# Patient Record
Sex: Female | Born: 2000 | Hispanic: No | Marital: Single | Smoking: Never smoker
Health system: Southern US, Community
[De-identification: ages and names within clinical notes are randomized; demographics above are authoritative.]

## PROBLEM LIST (undated history)

## (undated) DIAGNOSIS — F32A Depression, unspecified: Secondary | ICD-10-CM

## (undated) DIAGNOSIS — F329 Major depressive disorder, single episode, unspecified: Secondary | ICD-10-CM

## (undated) DIAGNOSIS — F419 Anxiety disorder, unspecified: Secondary | ICD-10-CM

---
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
## 2019-04-14 ENCOUNTER — Ambulatory Visit (INDEPENDENT_AMBULATORY_CARE_PROVIDER_SITE_OTHER): Payer: BLUE CROSS/BLUE SHIELD | Admitting: Physician Assistant

## 2019-04-14 ENCOUNTER — Encounter: Payer: Self-pay | Admitting: Physician Assistant

## 2019-04-14 VITALS — BP 139/94 | HR 103 | Ht 63.0 in | Wt 195.0 lb

## 2019-04-14 DIAGNOSIS — F410 Panic disorder [episodic paroxysmal anxiety] without agoraphobia: Secondary | ICD-10-CM

## 2019-04-14 DIAGNOSIS — F419 Anxiety disorder, unspecified: Secondary | ICD-10-CM

## 2019-04-14 MED ORDER — CLONAZEPAM 0.5 MG PO TABS: 0.5000 mg | ORAL_TABLET | Freq: Two times a day (BID) | ORAL | 1 refills | Status: DC | PRN

## 2019-04-14 NOTE — Progress Notes (Signed)
Patient having increased issues with anxiety. PHQ9-GAD7 completed.  Parents have recently separated and she thinks this is contributing.
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
## 2019-04-17 DIAGNOSIS — F419 Anxiety disorder, unspecified: Secondary | ICD-10-CM | POA: Insufficient documentation

## 2019-04-17 DIAGNOSIS — F410 Panic disorder [episodic paroxysmal anxiety] without agoraphobia: Secondary | ICD-10-CM | POA: Insufficient documentation

## 2019-04-17 DIAGNOSIS — F329 Major depressive disorder, single episode, unspecified: Secondary | ICD-10-CM | POA: Insufficient documentation

## 2019-05-12 ENCOUNTER — Encounter: Payer: Self-pay | Admitting: Sports Medicine

## 2019-05-12 ENCOUNTER — Ambulatory Visit (INDEPENDENT_AMBULATORY_CARE_PROVIDER_SITE_OTHER): Payer: BC Managed Care – PPO | Admitting: Sports Medicine

## 2019-05-12 DIAGNOSIS — F329 Major depressive disorder, single episode, unspecified: Secondary | ICD-10-CM | POA: Diagnosis not present

## 2019-05-12 DIAGNOSIS — F419 Anxiety disorder, unspecified: Secondary | ICD-10-CM

## 2019-05-12 DIAGNOSIS — F32A Depression, unspecified: Secondary | ICD-10-CM

## 2019-05-12 MED ORDER — VORTIOXETINE HBR 5 MG PO TABS: 5.0000 mg | ORAL_TABLET | Freq: Every day | ORAL | 3 refills | Status: DC

## 2019-05-12 NOTE — Assessment & Plan Note (Signed)
Partial improvement with Klonopin but anxiety is becoming severe to the point where she is needing it daily, for this reason we are going to add a controller agent. Has not done well with Lexapro in the past, weight gain with other SSRIs. We are going to try Trintellix, starting at 5 mg, they will download a discount coupon. We can do another virtual visit in 1 month either myself or Iran Planas, PA-C to evaluate efficacy and increase dose if needed.

## 2019-05-12 NOTE — Progress Notes (Signed)
Virtual Visit via WebEx/MyChart   I connected with  Rachel Padilla  on 05/12/19 via WebEx/MyChart/Doximity Video and verified that I am speaking with the correct person using two identifiers.   I discussed the limitations, risks, security and privacy concerns of performing an evaluation and management service by WebEx/MyChart/Doximity Video, including the higher likelihood of inaccurate diagnosis and treatment, and the availability of in person appointments.  We also discussed the likely need of an additional face to face encounter for complete and high quality delivery of care.  I also discussed with the patient that there may be a patient responsible charge related to this service. The patient expressed understanding and wishes to proceed.  Provider location is either at home or medical facility. Patient location is at their home, different from provider location. People involved in care of the patient during this telehealth encounter were myself, my nurse/medical assistant, and my front office/scheduling team member.  Subjective:    CC: Anxiety  HPI: This is a very pleasant 18 year old female, she has no generalized anxiety disorder, unfortunately she has had to use more, and more of her benzodiazepine.  She is interested in something to control her anxiety and to decrease her use of her Klonopin.  No suicidal or homicidal ideation.  She has failed Lexapro in the past, as well as had bad responses to Zoloft.  I reviewed the past medical history, family history, social history, surgical history, and allergies today and no changes were needed.  Please see the problem list section below in epic for further details.  Past Medical History: No past medical history on file. Past Surgical History: No past surgical history on file. Social History: Social History   Socioeconomic History  . Marital status: Single    Spouse name: Not on file  . Number of children: Not on file  . Years of  education: Not on file  . Highest education level: Not on file  Occupational History  . Not on file  Social Needs  . Financial resource strain: Not on file  . Food insecurity    Worry: Not on file    Inability: Not on file  . Transportation needs    Medical: Not on file    Non-medical: Not on file  Tobacco Use  . Smoking status: Never Smoker  . Smokeless tobacco: Never Used  Substance and Sexual Activity  . Alcohol use: Not on file  . Drug use: Not on file  . Sexual activity: Never  Lifestyle  . Physical activity    Days per week: Not on file    Minutes per session: Not on file  . Stress: Not on file  Relationships  . Social Musicianconnections    Talks on phone: Not on file    Gets together: Not on file    Attends religious service: Not on file    Active member of club or organization: Not on file    Attends meetings of clubs or organizations: Not on file    Relationship status: Not on file  Other Topics Concern  . Not on file  Social History Narrative  . Not on file   Family History: Family History  Problem Relation Age of Onset  . Hypertension Father    Allergies: No Known Allergies Medications: See med rec.  Review of Systems: No fevers, chills, night sweats, weight loss, chest pain, or shortness of breath.   Objective:    General: Speaking full sentences, no audible heavy breathing.  Sounds alert  and appropriately interactive.  Appears well.  Face symmetric.  Extraocular movements intact.  Pupils equal and round.  No nasal flaring or accessory muscle use visualized.  No other physical exam performed due to the non-physical nature of this visit.  Impression and Recommendations:    Anxiety and depression Partial improvement with Klonopin but anxiety is becoming severe to the point where she is needing it daily, for this reason we are going to add a controller agent. Has not done well with Lexapro in the past, weight gain with other SSRIs. We are going to try  Trintellix, starting at 5 mg, they will download a discount coupon. We can do another virtual visit in 1 month either myself or Iran Planas, PA-C to evaluate efficacy and increase dose if needed.  I discussed the above assessment and treatment plan with the patient. The patient was provided an opportunity to ask questions and all were answered. The patient agreed with the plan and demonstrated an understanding of the instructions.   The patient was advised to call back or seek an in-person evaluation if the symptoms worsen or if the condition fails to improve as anticipated.   I provided 25 minutes of non-face-to-face time during this encounter, 15 minutes of additional time was needed to gather information, review chart, records, communicate/coordinate with staff remotely, troubleshooting the multiple errors that we get every time when trying to do video calls through the electronic medical record, WebEx, and Doximity, restart the encounter multiple times due to instability of the software, as well as complete documentation.   ___________________________________________ Gwen Her. Dianah Field, M.D., ABFM., CAQSM. Primary Care and Sports Medicine [facility]  Adjunct Professor of [employer]

## 2019-05-23 ENCOUNTER — Telehealth: Payer: Self-pay | Admitting: *Deleted

## 2019-05-23 NOTE — Telephone Encounter (Signed)
I've received a vm from the pt and one from her counselor.  Counselor wants your cooperation in changing Rachel Padilla's Trintellix to Prozac.  She wants to start her at 10mg  for 10 days then increase to 20mg .  Pt was just started on Trintellix on June 12th. Please advise on what you would like the pt to do.

## 2019-05-23 NOTE — Telephone Encounter (Signed)
It has not been long enough for it to work, Prozac takes even longer to work.  We need to give it a solid month before calling it a failure.  The reason we picked Trintellix was that it was more weight neutral than the older SSRIs such as Prozac.  I am happy to discuss this with them in a virtual visit.

## 2019-05-24 MED ORDER — FLUOXETINE HCL 20 MG PO TABS: ORAL_TABLET | ORAL | 3 refills | Status: DC

## 2019-05-24 NOTE — Telephone Encounter (Signed)
Switching to Prozac, return in 1 month to reevaluate.

## 2019-05-24 NOTE — Telephone Encounter (Signed)
Pt called again. She is not interested in a virtual visit because she does not want to continue taking the Trintellix she wants to take Prozac. Prozac was recommended by her therapist and she trusts her. When she takes the Trintellix, it makes her tired during the day and she does not want to do anything , she cannot sleep at night and on Sunday she breakdown and had a crying spell.

## 2019-05-24 NOTE — Telephone Encounter (Signed)
Spoke with patient, she will talk with her mother, and discuss if she needs to do do a virtual visit.

## 2019-05-25 NOTE — Telephone Encounter (Signed)
Called and advised pt of medication change. She states she will call back later to schedule her 1 month follow up

## 2019-06-19 ENCOUNTER — Encounter: Payer: Self-pay | Admitting: Sports Medicine

## 2019-06-19 ENCOUNTER — Other Ambulatory Visit: Payer: Self-pay

## 2019-06-19 ENCOUNTER — Ambulatory Visit: Payer: BC Managed Care – PPO | Admitting: Sports Medicine

## 2019-06-19 DIAGNOSIS — F329 Major depressive disorder, single episode, unspecified: Secondary | ICD-10-CM

## 2019-06-19 DIAGNOSIS — F419 Anxiety disorder, unspecified: Secondary | ICD-10-CM | POA: Diagnosis not present

## 2019-06-19 DIAGNOSIS — W57XXXA Bitten or stung by nonvenomous insect and other nonvenomous arthropods, initial encounter: Secondary | ICD-10-CM | POA: Diagnosis not present

## 2019-06-19 DIAGNOSIS — F410 Panic disorder [episodic paroxysmal anxiety] without agoraphobia: Secondary | ICD-10-CM

## 2019-06-19 DIAGNOSIS — F32A Depression, unspecified: Secondary | ICD-10-CM

## 2019-06-19 MED ORDER — CLONAZEPAM 0.5 MG PO TABS: 0.5000 mg | ORAL_TABLET | Freq: Two times a day (BID) | ORAL | 2 refills | Status: DC | PRN

## 2019-06-19 MED ORDER — DOXYCYCLINE HYCLATE 100 MG PO TABS: 100.0000 mg | ORAL_TABLET | Freq: Two times a day (BID) | ORAL | 0 refills | Status: AC

## 2019-06-19 MED ORDER — PREDNISONE 50 MG PO TABS: ORAL_TABLET | ORAL | 0 refills | Status: DC

## 2019-06-19 NOTE — Assessment & Plan Note (Signed)
Improved considerably with Prozac and occasional Klonopin. She is happy, she is more active, interactive, and living a full her life with her current medication regimen.

## 2019-06-19 NOTE — Patient Instructions (Signed)
Bedbugs  Bedbugs are tiny bugs that live in and around beds. They stay hidden during the day, and they come out at night and bite. Bedbugs need blood to live and grow. Where are bedbugs found? Bedbugs can be found anywhere, whether a place is clean or dirty. They are found in places where many people come and go, such as hotels, shelters, dorms, and health care settings. It is also common for them to be found in homes where there are many birds or bats nearby. What are bedbug bites like?  A bedbug bite leaves a small red bump with a darker red dot in the middle. The bump may appear soon after a person is bitten or one or more days later. Bedbug bites usually do not hurt, but they may itch. Most people do not need treatment for bedbug bites. The bumps usually go away on their own in a few days. If you have a lot of bedbug bites and they feel very itchy:  Do not scratch the bite areas.  You may apply any of these to the bite area as told by your health care provider: ? A baking soda paste. Make this by adding water to baking soda. ? Cortisone cream. ? Calamine lotion. How do I check for bedbugs? Adult bedbugs are reddish-brown, oval, and flat. They are about as long as a grain of rice ( inch or 5-7 mm), and they cannot fly. Young bedbugs (nymphs) are smaller, and they are whitish-yellow or clear (translucent). Using a flashlight, look for bedbugs in these places:  On mattresses, bed frames, headboards, and box springs.  On drapes and curtains in bedrooms.  Under carpeting in bedrooms.  Behind electrical outlets.  Behind any wallpaper that is peeling.  Inside luggage. Also look for black or red spots or stains on or near the bed. Stains can come from bedbugs that have been crushed or from bedbug waste. What should I do if I find bedbugs? When traveling If you find bedbugs while traveling, check all of your possessions carefully before you bring them into your home. Consider throwing  away anything that has bedbugs on it. At home If you find bedbugs at home, your bedroom may need to be treated by a pest control expert. You may also need to throw away mattresses or luggage. To help prevent bedbugs from coming back, consider taking these actions:  Wash your clothes and bedding in water that is hotter than 120F (48.9C) and dry them on a hot setting. Bedbugs are killed by high temperatures.  Put a plastic cover over your mattress.  When sleeping, wear pajamas that have long sleeves and pant legs. Bedbugs usually bite areas of the skin that are not covered.  Vacuum often around the bed and in all of the cracks and crevices where the bugs might hide.  Carefully check all used furniture, bedding, or clothes that you bring into your home.  Eliminate bird nests and bat roosts that are near your home. Where to find more information  [employer] Investment banker, operational (EPA): BluetoothSpecialist.co.nz Summary  Bedbugs are tiny bugs that live in and around beds.  Bedbugs are most often found in places where many people come and go, such as hotels, shelters, dorms, and health care settings.  A bedbug bite leaves a small red bump with a darker red dot in the middle.  Bedbug bites usually do not hurt, but they may itch.  If you find bedbugs at home, your bedroom may need to  be treated by a pest control expert. This information is not intended to replace advice given to you by your health care provider. Make sure you discuss any questions you have with your health care provider. Document Released: 12/19/2010 Document Revised: 10/29/2017 Document Reviewed: 07/09/2017 Elsevier Patient Education  2020 Elsevier Inc.    IT sales professionalnsect Bite, Adult An insect bite can make your skin red, itchy, and swollen. An insect bite is different from an insect sting, which happens when an insect injects poison (venom) into the skin. Some insects can spread disease to people through a bite. However, most  insect bites do not lead to disease and are not serious. What are the causes? Insects may bite for a variety of reasons, including:  Hunger.  To defend themselves. Insects that bite include:  Spiders.  Mosquitoes.  Ticks.  Fleas.  Ants.  Flies.  Kissing bugs.  Chiggers. What are the signs or symptoms? Symptoms of this condition include:  Itching or pain in the bite area.  Redness and swelling in the bite area.  An open wound (skin ulcer). In many cases, symptoms last for 2-4 days. In rare cases, a person may have a severe allergic reaction (anaphylactic reaction) to a bite. Symptoms of an anaphylactic reaction may include:  Feeling warm in the face (flushed). This may include redness.  Itchy, red, swollen areas of skin (hives).  Swelling of the eyes, lips, face, mouth, tongue, or throat.  Difficulty breathing, speaking, or swallowing.  Noisy breathing (wheezing).  Dizziness or light-headedness.  Fainting.  Pain or cramping in the abdomen.  Vomiting.  Diarrhea. How is this diagnosed? This condition is usually diagnosed based on symptoms and a physical exam. How is this treated? Treatment is usually not needed. Symptoms often go away on their own. When treatment is recommended, it may involve:  Applying a cream or lotion to the bite area. This treatment helps with itching.  Taking an antibiotic medicine. This treatment is needed if the bite area gets infected.  Getting a tetanus shot, if you are not up to date on this vaccine.  Applying ice to the affected area.  Allergy medicines called antihistamines. This treatment may be needed if you develop itching or an allergic reaction to the insect bite.  Giving yourself an epinephrine injection if you have an anaphylactic reaction to a bite. To give the injection, you will use what is commonly called an auto-injector "pen" (pre-filled automatic epinephrine injection device). Your health care provider will  teach you how to use an auto-injector pen. Follow these instructions at home: Bite area care   Do not scratch the bite area.  Keep the bite area clean and dry. Wash it every day with soap and water as told by your health care provider.  Check the bite area every day for signs of infection. Check for: ? Redness, swelling, or pain. ? Fluid or blood. ? Warmth. ? Pus or a bad smell. Managing pain, itching, and swelling   You may apply cortisone cream, calamine lotion, or a paste made of baking soda and water to the bite area as told by your health care provider.  If directed, put ice on the bite area. ? Put ice in a plastic bag. ? Place a towel between your skin and the bag. ? Leave the ice on for 20 minutes, 2-3 times a day. General instructions  Apply or take over-the-counter and prescription medicines only as told by your health care provider.  If you were prescribed an  antibiotic medicine, take or apply it as told by your health care provider. Do not stop using the antibiotic even if your condition improves.  Keep all follow-up visits as told by your health care provider. This is important. How is this prevented? To help reduce your risk of insect bites:  When you are outdoors, wear clothing that covers your arms and legs. This is especially important in the early morning and evening.  Use insect repellent. The best insect repellents contain DEET, picaridin, oil of lemon eucalyptus (OLE), or IR3535.  Consider spraying your clothing with a pesticide called permethrin. Permethrin helps prevent insect bites. It works for several weeks and for up to 5-6 clothing washes. Do not apply permethrin directly to the skin.  If your home windows do not have screens, consider installing them.  If you will be sleeping in an area where there are mosquitoes, consider covering your sleeping area with a mosquito net. Contact a health care provider if:  You have redness, swelling, or pain in  the bite area.  You have fluid or blood coming from the bite area.  The bite area feels warm to the touch.  You have pus or a bad smell coming from the bite area.  You have a fever. Get help right away if:  You have joint pain.  You have a rash.  You feel unusually tired or sleepy.  You have neck pain.  You have a headache.  You have unusual weakness.  You develop symptoms of an anaphylactic reaction. These may include: ? Flushed skin. ? Hives. ? Swelling of the eyes, lips, face, mouth, tongue, or throat. ? Difficulty breathing, speaking, or swallowing. ? Wheezing. ? Dizziness or light-headedness. ? Fainting. ? Pain or cramping in the abdomen. ? Vomiting. ? Diarrhea. These symptoms may represent a serious problem that is an emergency. Do not wait to see if the symptoms will go away. Do the following right away:  Use the auto-injector pen as you have been instructed.  Get medical help. Call your local emergency services (911 in the [place] Do not drive yourself to the hospital. Summary  An insect bite can make your skin red, itchy, and swollen.  Treatment is usually not needed. Symptoms often go away on their own. When treatment is recommended, it may involve taking medicine, applying medicine to the area, or applying ice.  Apply or take over-the-counter and prescription medicines only as told by your health care provider.  Use insect repellent to help prevent insect bites.  Contact a health care provider if you have any signs of infection in the bite area. This information is not intended to replace advice given to you by your health care provider. Make sure you discuss any questions you have with your health care provider. Document Released: 12/24/2004 Document Revised: 05/27/2018 Document Reviewed: 05/27/2018 Elsevier Patient Education  2020 [employer]

## 2019-06-19 NOTE — Assessment & Plan Note (Signed)
Bedbugs versus chiggers. This occurred at her father's house, she usually spends the night at her mother's place. Adding prednisone, doxycycline as these are intensely pruritic. These are not consistent with scabies or poison ivy dermatitis. I have advised her to tell her father to wash all linens in hot water and wrap tape sticky side out around the legs to the beds, this can be used to diagnose bedbugs as they get stuck on the tape.

## 2019-06-19 NOTE — Progress Notes (Signed)
Subjective:    CC: Skin rash  HPI: This is a pleasant 18 year old female, recently she went to her father's house who lives by the weight, she sat out on the patio, she did really well, she woke up the next morning with itchy bites over her thighs, her buttock.  Lower legs.  Nothing on her torso, arms, it is intensely pruritic, a few are minimally painful.  Anxiety and depression: Improved considerably with Prozac, needs an occasional clonazepam but otherwise happy with how things are going.  I reviewed the past medical history, family history, social history, surgical history, and allergies today and no changes were needed.  Please see the problem list section below in epic for further details.  Past Medical History: No past medical history on file. Past Surgical History: No past surgical history on file. Social History: Social History   Socioeconomic History  . Marital status: Single    Spouse name: Not on file  . Number of children: Not on file  . Years of education: Not on file  . Highest education level: Not on file  Occupational History  . Not on file  Social Needs  . Financial resource strain: Not on file  . Food insecurity    Worry: Not on file    Inability: Not on file  . Transportation needs    Medical: Not on file    Non-medical: Not on file  Tobacco Use  . Smoking status: Never Smoker  . Smokeless tobacco: Never Used  Substance and Sexual Activity  . Alcohol use: Not on file  . Drug use: Not on file  . Sexual activity: Never  Lifestyle  . Physical activity    Days per week: Not on file    Minutes per session: Not on file  . Stress: Not on file  Relationships  . Social Musicianconnections    Talks on phone: Not on file    Gets together: Not on file    Attends religious service: Not on file    Active member of club or organization: Not on file    Attends meetings of clubs or organizations: Not on file    Relationship status: Not on file  Other Topics Concern   . Not on file  Social History Narrative  . Not on file   Family History: Family History  Problem Relation Age of Onset  . Hypertension Father    Allergies: No Known Allergies Medications: See med rec.  Review of Systems: No fevers, chills, night sweats, weight loss, chest pain, or shortness of breath.   Objective:    General: Well Developed, well nourished, and in no acute distress.  Neuro: Alert and oriented x3, extra-ocular muscles intact, sensation grossly intact.  HEENT: Normocephalic, atraumatic, pupils equal round reactive to light, neck supple, no masses, no lymphadenopathy, thyroid nonpalpable.  Skin: Warm and dry, there are multiple reddish papules over the legs, buttocks, thighs. Cardiac: Regular rate and rhythm, no murmurs rubs or gallops, no lower extremity edema.  Respiratory: Clear to auscultation bilaterally. Not using accessory muscles, speaking in full sentences.  Impression and Recommendations:    Arthropod bite Bedbugs versus chiggers. This occurred at her father's house, she usually spends the night at her mother's place. Adding prednisone, doxycycline as these are intensely pruritic. These are not consistent with scabies or poison ivy dermatitis. I have advised her to tell her father to wash all linens in hot water and wrap tape sticky side out around the legs to the beds, this  can be used to diagnose bedbugs as they get stuck on the tape.  Anxiety and depression Improved considerably with Prozac and occasional Klonopin. She is happy, she is more active, interactive, and living a full her life with her current medication regimen.   ___________________________________________ Gwen Her. Dianah Field, M.D., ABFM., CAQSM. Primary Care and Sports Medicine [facility]  Adjunct Professor of [employer] [facility] [facility] [employer] [facility] [employer]

## 2019-08-18 ENCOUNTER — Other Ambulatory Visit: Payer: Self-pay | Admitting: *Deleted

## 2019-08-18 DIAGNOSIS — F410 Panic disorder [episodic paroxysmal anxiety] without agoraphobia: Secondary | ICD-10-CM

## 2019-08-18 MED ORDER — CLONAZEPAM 0.5 MG PO TABS: 0.5000 mg | ORAL_TABLET | Freq: Two times a day (BID) | ORAL | 2 refills | Status: DC | PRN

## 2019-08-24 ENCOUNTER — Telehealth: Payer: Self-pay | Admitting: Sports Medicine

## 2019-08-24 NOTE — Telephone Encounter (Signed)
Patient calling in stating that she needs a refill on clonazePAM (KLONOPIN) 0.5 MG tablet [[id number]]. Please advise.

## 2019-08-24 NOTE — Telephone Encounter (Signed)
Rx last written on 08/18/19 with refills that were sent to [facility] [place] - [address] AT [address]  Left VM for Pt to call pharmacy

## 2019-09-11 ENCOUNTER — Emergency Department
Admission: EM | Admit: 2019-09-11 | Discharge: 2019-09-11 | Disposition: A | Discharge status: Home / Self Care | Payer: BC Managed Care – PPO | Source: Home / Self Care

## 2019-09-11 ENCOUNTER — Other Ambulatory Visit: Payer: Self-pay

## 2019-09-11 DIAGNOSIS — Z20828 Contact with and (suspected) exposure to other viral communicable diseases: Secondary | ICD-10-CM | POA: Diagnosis not present

## 2019-09-11 DIAGNOSIS — Z20822 Contact with and (suspected) exposure to covid-19: Secondary | ICD-10-CM

## 2019-09-11 NOTE — ED Triage Notes (Signed)
Pt here today for COVID testing. Lives with mother who is symptomatic and has had known exposure.

## 2019-09-13 ENCOUNTER — Telehealth: Payer: Self-pay

## 2019-09-13 LAB — NOVEL CORONAVIRUS, NAA: SARS-CoV-2, NAA: NOT DETECTED

## 2019-09-13 MED ORDER — FLUOXETINE HCL 20 MG PO TABS: ORAL_TABLET | ORAL | 0 refills | Status: DC

## 2019-09-13 NOTE — Telephone Encounter (Signed)
90 day RX sent per pt request, advised her she would be due for follow up

## 2019-09-20 ENCOUNTER — Other Ambulatory Visit: Payer: Self-pay | Admitting: Sports Medicine

## 2019-09-22 ENCOUNTER — Other Ambulatory Visit: Payer: Self-pay | Admitting: Sports Medicine

## 2019-10-02 ENCOUNTER — Emergency Department
Admission: EM | Admit: 2019-10-02 | Discharge: 2019-10-02 | Disposition: A | Discharge status: Home / Self Care | Payer: BC Managed Care – PPO | Source: Home / Self Care | Attending: Family Medicine | Admitting: Family Medicine

## 2019-10-02 ENCOUNTER — Other Ambulatory Visit: Payer: Self-pay

## 2019-10-02 DIAGNOSIS — Z0189 Encounter for other specified special examinations: Secondary | ICD-10-CM | POA: Diagnosis not present

## 2019-10-02 DIAGNOSIS — Z20828 Contact with and (suspected) exposure to other viral communicable diseases: Secondary | ICD-10-CM | POA: Diagnosis not present

## 2019-10-02 DIAGNOSIS — Z20822 Contact with and (suspected) exposure to covid-19: Secondary | ICD-10-CM

## 2019-10-02 HISTORY — DX: Depression, unspecified: F32.A

## 2019-10-02 HISTORY — DX: Anxiety disorder, unspecified: F41.9

## 2019-10-02 NOTE — Discharge Instructions (Addendum)
If respiratory symptoms develop, try the following: Take plain guaifenesin (1220m extended release tabs such as Mucinex) twice daily, with plenty of water, for cough and congestion.  May add Pseudoephedrine (343m one or two every 4 to 6 hours) for sinus congestion.  Get adequate rest.   Try warm salt water gargles for sore throat.  Stop all antihistamines for now, and other non-prescription cough/cold preparations. May take Ibuprofen 20061m4 tabs every 8 hours with food for sore throat, headache, fever, etc. May take Delsym Cough Suppressant at bedtime for nighttime cough.   If symptoms become significantly worse during the night or over the weekend, proceed to the local emergency room.   If COVID-19 test is positive, isolate yourself until the below conditions are met: 1)  At least 7 days since symptoms onset. AND 2)  > 72 hours after symptom resolution (absence of fever without the use of fever-reducing medicine, and improvement in respiratory symptoms.

## 2019-10-02 NOTE — ED Provider Notes (Signed)
Rachel Padilla CARE    CSN: [id number] Arrival date & time: 10/02/19  1531      History   Chief Complaint Chief Complaint  Patient presents with  . exposure to covid    HPI Rachel Padilla is a 18 y.o. female.   Patient complains of mild fatigue and a headache for 2 days.  She has been exposed to someone with [place] and would like to be tested.  She feels well otherwise and denies changes in taste, chest tightness, fever, shortness of breath, etc.  The history is provided by the patient.    Past Medical History:  Diagnosis Date  . Anxiety   . Depression     Patient Active Problem List   Diagnosis Date Noted  . Arthropod bite 06/19/2019  . Anxiety and depression 04/17/2019  . Rhinitis 01/28/2017  . Acne vulgaris 08/26/2015  . Preventive measure 05/23/2014    History reviewed. No pertinent surgical history.  OB History   No obstetric history on file.      Home Medications    Prior to Admission medications   Medication Sig Start Date End Date Taking? Authorizing Provider  clonazePAM (KLONOPIN) 0.5 MG tablet Take 1 tablet (0.5 mg total) by mouth 2 (two) times daily as needed for anxiety. 08/18/19   Silverio Decamp, MD  FLUoxetine (PROZAC) 20 MG tablet 1 tablet daily. Needs appt for further fills 09/13/19   Silverio Decamp, MD  Levonorgestrel-Ethinyl Estradiol (AMETHIA) 0.1-0.02 & 0.01 MG tablet Take by mouth. 09/29/18   [provider]  predniSONE (DELTASONE) 50 MG tablet One tab PO daily for 5 days. 06/19/19   Silverio Decamp, MD    Family History Family History  Problem Relation Age of Onset  . Hypertension Father     Social History Social History   Tobacco Use  . Smoking status: Never Smoker  . Smokeless tobacco: Never Used  Substance Use Topics  . Alcohol use: Not Currently  . Drug use: Not Currently     Allergies   Patient has no known allergies.   Review of Systems Review of Systems No sore throat No  cough No pleuritic pain No wheezing No nasal congestion No post-nasal drainage No sinus pain/pressure No itchy/red eyes No earache No hemoptysis No SOB No fever/chills No nausea No vomiting No abdominal pain No diarrhea No urinary symptoms No skin rash + fatigue No myalgias + headache Used OTC meds without relief   Physical Exam Triage Vital Signs ED Triage Vitals  Enc Vitals Group     BP 10/02/19 1626 120/81     Pulse Rate 10/02/19 1626 (!) 112     Resp 10/02/19 1626 20     Temp 10/02/19 1626 98.6 F (37 C)     Temp Source 10/02/19 1626 Oral     SpO2 10/02/19 1626 97 %     Weight 10/02/19 1622 205 lb (93 kg)     Height 10/02/19 1622 _0  (1.651 m)     Head Circumference --      Peak Flow --      Pain Score 10/02/19 1622 0     Pain Loc --      Pain Edu? --      Excl. in [place]? --    No data found.  Updated Vital Signs BP 120/81 (BP Location: Left Arm)   Pulse (!) 112   Temp 98.6 F (37 C) (Oral)   Resp 20   Ht _1  (1.651 m)  Wt 93 kg   LMP 09/11/2019   SpO2 97%   BMI 34.11 kg/m   Visual Acuity Right Eye Distance:   Left Eye Distance:   Bilateral Distance:    Right Eye Near:   Left Eye Near:    Bilateral Near:     Physical Exam Nursing notes and Vital Signs reviewed. Appearance:  Patient appears stated age, and in no acute distress Eyes:  Pupils are equal, round, and reactive to light and accomodation.  Extraocular movement is intact.  Conjunctivae are not inflamed  Ears:  Canals normal.  Tympanic membranes normal.  Nose:  Mildly congested turbinates.  No sinus tenderness.   Pharynx:  Normal Neck:  Supple.  Posterior/lateral nodes are mildly tender to palpation on the left.   Lungs:  Clear to auscultation.  Breath sounds are equal.  Moving air well. Heart:  Regular rate and rhythm without murmurs, rubs, or gallops.  Abdomen:  Nontender without masses or hepatosplenomegaly.  Bowel sounds are present.  No CVA or flank tenderness.   Extremities:  No edema.  Skin:  No rash present.    UC Treatments / Results  Labs (all labs ordered are listed, but only abnormal results are displayed) Labs Reviewed  NOVEL CORONAVIRUS, NAA    EKG   Radiology No results found.  Procedures Procedures (including critical care time)  Medications Ordered in UC Medications - No data to display  Initial Impression / Assessment and Plan / UC Course  I have reviewed the triage vital signs and the nursing notes.  Pertinent labs & imaging results that were available during my care of the patient were reviewed by me and considered in my medical decision making (see chart for details).     Suspect early viral URI.  There is no evidence of bacterial infection today.   COVID19 test pending. Treat symptomatically for now    Final Clinical Impressions(s) / UC Diagnoses   Final diagnoses:  Encounter for routine laboratory testing     Discharge Instructions     If respiratory symptoms develop, try the following: Take plain guaifenesin (1268m extended release tabs such as Mucinex) twice daily, with plenty of water, for cough and congestion.  May add Pseudoephedrine (358m one or two every 4 to 6 hours) for sinus congestion.  Get adequate rest.   Try warm salt water gargles for sore throat.  Stop all antihistamines for now, and other non-prescription cough/cold preparations. May take Ibuprofen 20029m4 tabs every 8 hours with food for sore throat, headache, fever, etc. May take Delsym Cough Suppressant at bedtime for nighttime cough.   If symptoms become significantly worse during the night or over the weekend, proceed to the local emergency room.   If COVID-19 test is positive, isolate yourself until the below conditions are met: 1)  At least 7 days since symptoms onset. AND 2)  > 72 hours after symptom resolution (absence of fever without the use of fever-reducing medicine, and improvement in respiratory symptoms.       ED  Prescriptions    None        BeeKandra NicolasD 10/02/19 [id number]

## 2019-10-02 NOTE — ED Triage Notes (Signed)
Pt has had an exposure to COVID.  Pt has had a headache for 2 days, has tried ibuprofen, and it has helped.  Also feeling fatigued.

## 2019-10-05 LAB — NOVEL CORONAVIRUS, NAA: SARS-CoV-2, NAA: NOT DETECTED

## 2019-11-06 ENCOUNTER — Encounter: Payer: Self-pay | Admitting: Sports Medicine

## 2019-11-06 ENCOUNTER — Ambulatory Visit (INDEPENDENT_AMBULATORY_CARE_PROVIDER_SITE_OTHER): Payer: BC Managed Care – PPO | Admitting: Sports Medicine

## 2019-11-06 DIAGNOSIS — F329 Major depressive disorder, single episode, unspecified: Secondary | ICD-10-CM

## 2019-11-06 DIAGNOSIS — F410 Panic disorder [episodic paroxysmal anxiety] without agoraphobia: Secondary | ICD-10-CM | POA: Diagnosis not present

## 2019-11-06 DIAGNOSIS — F419 Anxiety disorder, unspecified: Secondary | ICD-10-CM | POA: Diagnosis not present

## 2019-11-06 DIAGNOSIS — F32A Depression, unspecified: Secondary | ICD-10-CM

## 2019-11-06 MED ORDER — CLONAZEPAM 0.5 MG PO TABS: 0.5000 mg | ORAL_TABLET | Freq: Two times a day (BID) | ORAL | 2 refills | Status: DC | PRN

## 2019-11-06 MED ORDER — FLUOXETINE HCL 20 MG PO TABS: 20.0000 mg | ORAL_TABLET | Freq: Every day | ORAL | 3 refills | Status: DC

## 2019-11-06 NOTE — Assessment & Plan Note (Addendum)
Doing extremely well with current dose of Prozac and an occasional Klonopin. She is happy, she and her mother are not able to speak to her father for 90 days, he is doing a substance abuse detox program right now. Because Rachel Padilla is doing so well I can go ahead and see her in 6 months.

## 2019-11-06 NOTE — Progress Notes (Signed)
Virtual Visit via Telephone   I connected with  Rachel Padilla  on 11/06/19 by telephone/telehealth and verified that I am speaking with the correct person using two identifiers.   I discussed the limitations, risks, security and privacy concerns of performing an evaluation and management service by telephone, including the higher likelihood of inaccurate diagnosis and treatment, and the availability of in person appointments.  We also discussed the likely need of an additional face to face encounter for complete and high quality delivery of care.  I also discussed with the patient that there may be a patient responsible charge related to this service. The patient expressed understanding and wishes to proceed.  Provider location is either at home or medical facility. Patient location is at their home, different from provider location. People involved in care of the patient during this telehealth encounter were myself, my nurse/medical assistant, and my front office/scheduling team member.  Subjective:    CC: Anxiety and depression  HPI: Rachel Padilla is doing extremely well, happy with her current medications.  I reviewed the past medical history, family history, social history, surgical history, and allergies today and no changes were needed.  Please see the problem list section below in epic for further details.  Past Medical History: Past Medical History:  Diagnosis Date  . Anxiety   . Depression    Past Surgical History: No past surgical history on file. Social History: Social History   Socioeconomic History  . Marital status: Single    Spouse name: Not on file  . Number of children: Not on file  . Years of education: Not on file  . Highest education level: Not on file  Occupational History  . Not on file  Social Needs  . Financial resource strain: Not on file  . Food insecurity    Worry: Not on file    Inability: Not on file  . Transportation needs    Medical: Not on file     Non-medical: Not on file  Tobacco Use  . Smoking status: Never Smoker  . Smokeless tobacco: Never Used  Substance and Sexual Activity  . Alcohol use: Not Currently  . Drug use: Not Currently  . Sexual activity: Never  Lifestyle  . Physical activity    Days per week: Not on file    Minutes per session: Not on file  . Stress: Not on file  Relationships  . Social Musician on phone: Not on file    Gets together: Not on file    Attends religious service: Not on file    Active member of club or organization: Not on file    Attends meetings of clubs or organizations: Not on file    Relationship status: Not on file  Other Topics Concern  . Not on file  Social History Narrative  . Not on file   Family History: Family History  Problem Relation Age of Onset  . Hypertension Father    Allergies: No Known Allergies Medications: See med rec.  Review of Systems: No fevers, chills, night sweats, weight loss, chest pain, or shortness of breath.   Objective:    General: Speaking full sentences, no audible heavy breathing.  Sounds alert and appropriately interactive.  No other physical exam performed due to the non-face to face nature of this visit.  Impression and Recommendations:    Anxiety and depression Doing extremely well with current dose of Prozac and an occasional Klonopin. She is happy, she and her  mother are not able to speak to her father for 90 days, he is doing a substance abuse detox program right now. Because Rachel Padilla is doing so well I can go ahead and see her in 6 months.   I discussed the above assessment and treatment plan with the patient. The patient was provided an opportunity to ask questions and all were answered. The patient agreed with the plan and demonstrated an understanding of the instructions.   The patient was advised to call back or seek an in-person evaluation if the symptoms worsen or if the condition fails to improve as anticipated.    I provided 25 minutes of non-face-to-face time during this encounter, 15 minutes of additional time was needed to gather information, review chart, records, communicate/coordinate with staff remotely, and complete documentation.   ___________________________________________ Gwen Her. Dianah Field, M.D., ABFM., CAQSM. Primary Care and Sports Medicine [facility]  Adjunct Professor of [employer]

## 2019-12-26 DIAGNOSIS — Z113 Encounter for screening for infections with a predominantly sexual mode of transmission: Secondary | ICD-10-CM | POA: Diagnosis not present

## 2019-12-26 DIAGNOSIS — Z01419 Encounter for gynecological examination (general) (routine) without abnormal findings: Secondary | ICD-10-CM | POA: Diagnosis not present

## 2019-12-26 DIAGNOSIS — Z3041 Encounter for surveillance of contraceptive pills: Secondary | ICD-10-CM | POA: Diagnosis not present

## 2020-04-02 DIAGNOSIS — Z113 Encounter for screening for infections with a predominantly sexual mode of transmission: Secondary | ICD-10-CM | POA: Diagnosis not present

## 2020-04-02 DIAGNOSIS — N3090 Cystitis, unspecified without hematuria: Secondary | ICD-10-CM | POA: Diagnosis not present

## 2020-04-02 DIAGNOSIS — B9689 Other specified bacterial agents as the cause of diseases classified elsewhere: Secondary | ICD-10-CM | POA: Diagnosis not present

## 2020-04-02 DIAGNOSIS — N760 Acute vaginitis: Secondary | ICD-10-CM | POA: Diagnosis not present

## 2020-04-02 DIAGNOSIS — R350 Frequency of micturition: Secondary | ICD-10-CM | POA: Diagnosis not present

## 2020-04-22 ENCOUNTER — Telehealth: Payer: Self-pay | Admitting: Sports Medicine

## 2020-04-22 ENCOUNTER — Encounter: Payer: Self-pay | Admitting: Sports Medicine

## 2020-04-22 ENCOUNTER — Ambulatory Visit (INDEPENDENT_AMBULATORY_CARE_PROVIDER_SITE_OTHER): Payer: BC Managed Care – PPO | Admitting: Sports Medicine

## 2020-04-22 DIAGNOSIS — E669 Obesity, unspecified: Secondary | ICD-10-CM | POA: Diagnosis not present

## 2020-04-22 DIAGNOSIS — F410 Panic disorder [episodic paroxysmal anxiety] without agoraphobia: Secondary | ICD-10-CM

## 2020-04-22 DIAGNOSIS — M722 Plantar fascial fibromatosis: Secondary | ICD-10-CM | POA: Insufficient documentation

## 2020-04-22 DIAGNOSIS — F32A Depression, unspecified: Secondary | ICD-10-CM

## 2020-04-22 DIAGNOSIS — F329 Major depressive disorder, single episode, unspecified: Secondary | ICD-10-CM

## 2020-04-22 DIAGNOSIS — F419 Anxiety disorder, unspecified: Secondary | ICD-10-CM

## 2020-04-22 MED ORDER — CLONAZEPAM 0.5 MG PO TABS: 0.5000 mg | ORAL_TABLET | Freq: Two times a day (BID) | ORAL | 2 refills | Status: DC | PRN

## 2020-04-22 MED ORDER — PHENTERMINE HCL 37.5 MG PO TABS: ORAL_TABLET | ORAL | 0 refills | Status: DC

## 2020-04-22 NOTE — Assessment & Plan Note (Signed)
Mild plantar fasciitis present for this past several days, we are going start conservatively, gel cups, rehab exercises, return if no better in a month. We will consider injection at that time.

## 2020-04-22 NOTE — Assessment & Plan Note (Signed)
Having an increase in anxiety, continue Prozac, refilling Klonopin. She will discuss this in further detail with her therapist.

## 2020-04-22 NOTE — Assessment & Plan Note (Signed)
Restarting phentermine, return monthly for weight checks and refills. 

## 2020-04-22 NOTE — Telephone Encounter (Signed)
Received fax for PA on Phentermine sent through cover my meds and received authorization.   Case ID: [id number] Valid: 03/23/2020 - 07/21/2020 - CF

## 2020-04-22 NOTE — Progress Notes (Signed)
    Procedures performed today:    None.  Independent interpretation of notes and tests performed by another provider:   None.  Brief History, Exam, Impression, and Recommendations:    Plantar fasciitis, left Mild plantar fasciitis present for this past several days, we are going start conservatively, gel cups, rehab exercises, return if no better in a month. We will consider injection at that time.  Obesity Restarting phentermine, return monthly for weight checks and refills.  Anxiety and depression Having an increase in anxiety, continue Prozac, refilling Klonopin. She will discuss this in further detail with her therapist.    ___________________________________________ Ihor Austin. Benjamin Stain, M.D., ABFM., CAQSM. Primary Care and Sports Medicine [facility]  Adjunct Instructor of Family Medicine  [employer]

## 2020-05-20 ENCOUNTER — Other Ambulatory Visit: Payer: Self-pay

## 2020-05-20 ENCOUNTER — Encounter: Payer: Self-pay | Admitting: Sports Medicine

## 2020-05-20 ENCOUNTER — Ambulatory Visit (INDEPENDENT_AMBULATORY_CARE_PROVIDER_SITE_OTHER): Payer: BC Managed Care – PPO | Admitting: Sports Medicine

## 2020-05-20 DIAGNOSIS — F329 Major depressive disorder, single episode, unspecified: Secondary | ICD-10-CM | POA: Diagnosis not present

## 2020-05-20 DIAGNOSIS — F419 Anxiety disorder, unspecified: Secondary | ICD-10-CM | POA: Diagnosis not present

## 2020-05-20 DIAGNOSIS — F32A Depression, unspecified: Secondary | ICD-10-CM

## 2020-05-20 DIAGNOSIS — E669 Obesity, unspecified: Secondary | ICD-10-CM | POA: Diagnosis not present

## 2020-05-20 MED ORDER — TOPIRAMATE 25 MG PO TABS: ORAL_TABLET | ORAL | 3 refills | Status: DC

## 2020-05-20 MED ORDER — PHENTERMINE HCL 37.5 MG PO CAPS: ORAL_CAPSULE | ORAL | 0 refills | Status: DC

## 2020-05-20 NOTE — Progress Notes (Signed)
° ° °  Procedures performed today:    None.  Independent interpretation of notes and tests performed by another provider:   None.  Brief History, Exam, Impression, and Recommendations:    Obesity No weight loss after the first month, I am going to add low-dose Topamax. She feels as though she might of had some nausea and vomiting with Topamax initially so we will start at 12.5 mg half tab at night for a week then a full 25 mg tab at night. She also desires to switch to phentermine capsules rather than tablets, return in a month for a weight check. This is a chronic disease process not at goal.  Anxiety and depression She has been doing well overall with regards to her anxiety, Prozac and current Klonopin dosing is adequate, there has been another incident with her father, and discovering additional cocaine use, she is going to get in with her therapist. Return to see me as needed for this.    ___________________________________________ Ihor Austin. Benjamin Stain, M.D., ABFM., CAQSM. Primary Care and Sports Medicine [facility]  Adjunct Instructor of Family Medicine  [employer] [facility] [employer]

## 2020-05-20 NOTE — Assessment & Plan Note (Signed)
No weight loss after the first month, I am going to add low-dose Topamax. She feels as though she might of had some nausea and vomiting with Topamax initially so we will start at 12.5 mg half tab at night for a week then a full 25 mg tab at night. She also desires to switch to phentermine capsules rather than tablets, return in a month for a weight check. This is a chronic disease process not at goal.

## 2020-05-20 NOTE — Assessment & Plan Note (Signed)
She has been doing well overall with regards to her anxiety, Prozac and current Klonopin dosing is adequate, there has been another incident with her father, and discovering additional cocaine use, she is going to get in with her therapist. Return to see me as needed for this.

## 2020-05-22 ENCOUNTER — Telehealth: Payer: Self-pay | Admitting: Sports Medicine

## 2020-05-22 NOTE — Telephone Encounter (Signed)
Received fax for PA on Phentermine sent through cover my meds and received authorization.   Case Id: [id number] Valid: 04/22/2020 - 08/20/2020 - CF

## 2020-05-28 ENCOUNTER — Encounter: Payer: Self-pay | Admitting: Family Medicine

## 2020-05-28 ENCOUNTER — Ambulatory Visit (INDEPENDENT_AMBULATORY_CARE_PROVIDER_SITE_OTHER): Payer: BC Managed Care – PPO | Admitting: Family Medicine

## 2020-05-28 DIAGNOSIS — J301 Allergic rhinitis due to pollen: Secondary | ICD-10-CM

## 2020-05-28 MED ORDER — FLUTICASONE PROPIONATE 50 MCG/ACT NA SUSP: 2.0000 | Freq: Every day | NASAL | 6 refills | Status: DC

## 2020-05-28 NOTE — Patient Instructions (Addendum)
Start flonase daily.  I would recommend adding an antihistamine such as cetirizine (zyrtec) as well  You can use warm compresses on the eye to help with gritty feeling and/or matting.   Stay well hydrated Let us know if symptoms worsen or are not improving over the next 7-10 days.

## 2020-05-28 NOTE — Assessment & Plan Note (Addendum)
Current symptoms consistent with allergies.  Recommend starting flonase daily with addition of antihistamine. Suggested cetirizine.  She may use warm compresses for matting/gritty sensation in eyes.   Discussed if symptoms continue to worsen or she doesn't notice any improvement over the next 7-10 days with consistent use of medication to let us know.

## 2020-05-28 NOTE — Progress Notes (Signed)
Rachel Padilla - 19 y.o. female MRN [id number]  Date of birth: 06-Aug-2001  Subjective Chief Complaint  Patient presents with  . Sore Throat    HPI Rachel Padilla is a 19 y.o. female here today with complaint of sore throat, cough, watery eyes with matting in the mornings, and nasal congestion.  She reports that symptoms started about 1 week ago.  Symptoms are often worse in the evening when she is laying down.  Notices some post nasal drainage when laying down as well.  She denies fever, chills, body aches, sinus pain, or GI symptoms.  She has not had any other sick contacts.   ROS:  A comprehensive ROS was completed and negative except as noted per HPI  No Known Allergies  Past Medical History:  Diagnosis Date  . Anxiety   . Depression     No past surgical history on file.  Social History   Socioeconomic History  . Marital status: Single    Spouse name: Not on file  . Number of children: Not on file  . Years of education: Not on file  . Highest education level: Not on file  Occupational History  . Not on file  Tobacco Use  . Smoking status: Never Smoker  . Smokeless tobacco: Never Used  Vaping Use  . Vaping Use: Every day  . Substances: Nicotine, Flavoring, Nicotine-salt  Substance and Sexual Activity  . Alcohol use: Not Currently  . Drug use: Not Currently  . Sexual activity: Never  Other Topics Concern  . Not on file  Social History Narrative  . Not on file   Social Determinants of Health   Financial Resource Strain:   . Difficulty of Paying Living Expenses:   Food Insecurity:   . Worried About Programme researcher, broadcasting/film/video in the Last Year:   . Barista in the Last Year:   Transportation Needs:   . Freight forwarder (Medical):   Marland Kitchen Lack of Transportation (Non-Medical):   Physical Activity:   . Days of Exercise per Week:   . Minutes of Exercise per Session:   Stress:   . Feeling of Stress :   Social Connections:   . Frequency of Communication with  Friends and Family:   . Frequency of Social Gatherings with Friends and Family:   . Attends Religious Services:   . Active Member of Clubs or Organizations:   . Attends Banker Meetings:   Marland Kitchen Marital Status:     Family History  Problem Relation Age of Onset  . Hypertension Father     Health Maintenance  Topic Date Due  . Hepatitis C Screening  Never done  . COVID-19 Vaccine (1) Never done  . HIV Screening  Never done  . INFLUENZA VACCINE  06/30/2020  . TETANUS/TDAP  08/16/2021     ----------------------------------------------------------------------------------------------------------------------------------------------------------------------------------------------------------------- Physical Exam BP 137/78 (BP Location: Right Arm, Patient Position: Sitting, Cuff Size: Normal)   Pulse (!) 123   Ht 5' 4.96" (1.65 m)   Wt 218 lb 1.9 oz (98.9 kg)   BMI 36.34 kg/m   Physical Exam Constitutional:      Appearance: Normal appearance. She is well-developed.  HENT:     Right Ear: Tympanic membrane normal.     Left Ear: Tympanic membrane normal.     Mouth/Throat:     Mouth: Mucous membranes are moist.  Eyes:     General: No scleral icterus.    Conjunctiva/sclera: Conjunctivae normal.  Cardiovascular:  Rate and Rhythm: Normal rate and regular rhythm.  Pulmonary:     Effort: Pulmonary effort is normal.     Breath sounds: Normal breath sounds.  Musculoskeletal:     Cervical back: Neck supple.  Skin:    General: Skin is warm and dry.  Neurological:     Mental Status: She is alert.  Psychiatric:        Mood and Affect: Mood normal.        Behavior: Behavior normal.     ------------------------------------------------------------------------------------------------------------------------------------------------------------------------------------------------------------------- Assessment and Plan  Rhinitis Current symptoms consistent with allergies.   Recommend starting flonase daily with addition of antihistamine. Suggested cetirizine.  She may use warm compresses for matting/gritty sensation in eyes.   Discussed if symptoms continue to worsen or she doesn't notice any improvement over the next 7-10 days with consistent use of medication to let us know.     Meds ordered this encounter  Medications  . fluticasone (FLONASE) 50 MCG/ACT nasal spray    Sig: Place 2 sprays into both nostrils daily.    Dispense:  16 g    Refill:  6    No follow-ups on file.    This visit occurred during the SARS-CoV-2 public health emergency.  Safety protocols were in place, including screening questions prior to the visit, additional usage of staff PPE, and extensive cleaning of exam room while observing appropriate contact time as indicated for disinfecting solutions.

## 2020-06-17 ENCOUNTER — Ambulatory Visit: Payer: BC Managed Care – PPO | Admitting: Sports Medicine

## 2020-06-17 ENCOUNTER — Encounter: Payer: Self-pay | Admitting: Sports Medicine

## 2020-06-17 ENCOUNTER — Other Ambulatory Visit: Payer: Self-pay

## 2020-06-17 DIAGNOSIS — F410 Panic disorder [episodic paroxysmal anxiety] without agoraphobia: Secondary | ICD-10-CM

## 2020-06-17 DIAGNOSIS — E669 Obesity, unspecified: Secondary | ICD-10-CM | POA: Diagnosis not present

## 2020-06-17 DIAGNOSIS — L2082 Flexural eczema: Secondary | ICD-10-CM

## 2020-06-17 MED ORDER — WEGOVY 0.5 MG/0.5ML ~~LOC~~ SOAJ: 0.5000 mg | SUBCUTANEOUS | 0 refills | Status: DC

## 2020-06-17 MED ORDER — WEGOVY 0.25 MG/0.5ML ~~LOC~~ SOAJ: 0.2500 mg | SUBCUTANEOUS | 0 refills | Status: DC

## 2020-06-17 MED ORDER — WEGOVY 1 MG/0.5ML ~~LOC~~ SOAJ: 1.0000 mg | SUBCUTANEOUS | 0 refills | Status: DC

## 2020-06-17 MED ORDER — TRIAMCINOLONE ACETONIDE 0.1 % EX OINT: 1.0000 "application " | TOPICAL_OINTMENT | Freq: Two times a day (BID) | CUTANEOUS | 6 refills | Status: DC

## 2020-06-17 MED ORDER — CLONAZEPAM 0.5 MG PO TABS: 0.5000 mg | ORAL_TABLET | Freq: Two times a day (BID) | ORAL | 2 refills | Status: DC | PRN

## 2020-06-17 NOTE — Assessment & Plan Note (Signed)
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 Profoundly pruritic. Adding topical triamcinolone, she will do typical skin care and avoid hot showers as well. Return to see me in a few months to see how things are going.

## 2020-06-17 NOTE — Patient Instructions (Signed)

## 2020-06-17 NOTE — Progress Notes (Signed)
    Procedures performed today:    None.  Independent interpretation of notes and tests performed by another provider:   None.  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 Profoundly pruritic. Adding topical triamcinolone, she will do typical skin care and avoid hot showers as well. Return to see me in a few months to see how things are going.  Obesity Rachel Padilla returns, she has not had success with phentermine, she also has not been able to tolerate topiramate in the past, and did not take it this time either. At this point we are going to switch speeds, adding Wegovy as well as referral to the healthy weight and wellness center. Return to see me in 3 months.    ___________________________________________ Rachel Padilla. Benjamin Stain, M.D., ABFM., CAQSM. Primary Care and Sports Medicine [facility]  Adjunct Instructor of Family Medicine  [employer] [facility] [employer]

## 2020-06-17 NOTE — Assessment & Plan Note (Signed)
Rachel Padilla returns, she has not had success with phentermine, she also has not been able to tolerate topiramate in the past, and did not take it this time either. At this point we are going to switch speeds, adding Wegovy as well as referral to the healthy weight and wellness center. Return to see me in 3 months.

## 2020-08-23 NOTE — Telephone Encounter (Signed)
If patient has a coupon for [facility] which she should we do not have to do Prior authorizations.  This is why we printed all the cards and the medication was being subscribed. - CF

## 2020-08-27 ENCOUNTER — Telehealth: Payer: Self-pay

## 2020-08-27 NOTE — Telephone Encounter (Signed)
Rachel Padilla states she never received the coupon for [facility]. Do you have a coupon?

## 2020-08-27 NOTE — Telephone Encounter (Signed)
Patient advised. Her mom will come by to pick up.

## 2020-08-27 NOTE — Telephone Encounter (Signed)
We have a few, if she can get here soon enough she can pick 1 up.

## 2020-09-20 ENCOUNTER — Ambulatory Visit: Payer: BC Managed Care – PPO | Admitting: Sports Medicine

## 2020-12-29 DIAGNOSIS — A6004 Herpesviral vulvovaginitis: Secondary | ICD-10-CM | POA: Insufficient documentation

## 2021-03-28 ENCOUNTER — Telehealth: Payer: Self-pay

## 2021-03-28 DIAGNOSIS — F4320 Adjustment disorder, unspecified: Secondary | ICD-10-CM | POA: Insufficient documentation

## 2021-03-28 NOTE — Telephone Encounter (Signed)
Referral placed for our own therapist here.

## 2021-03-28 NOTE — Telephone Encounter (Signed)
Patient called requesting a referral to a therapist following her brother's diagnosis.

## 2021-03-28 NOTE — Assessment & Plan Note (Signed)
Anticipatory grieving, brother just diagnosed with brain tumor, he will be undergoing surgery, adding some grief counseling.

## 2021-03-28 NOTE — Telephone Encounter (Signed)
In looking at referral, it looks like they have already left a message for patient.  Left message for patient that her message was received, a referral was placed, and a message was left for her to schedule. To please check her VM and response quickly to the get scheduled as the appt fill up.

## 2021-08-18 ENCOUNTER — Other Ambulatory Visit: Payer: Self-pay | Admitting: Sports Medicine

## 2021-08-18 DIAGNOSIS — F410 Panic disorder [episodic paroxysmal anxiety] without agoraphobia: Secondary | ICD-10-CM

## 2021-08-18 MED ORDER — CLONAZEPAM 0.5 MG PO TABS: 0.5000 mg | ORAL_TABLET | Freq: Two times a day (BID) | ORAL | 2 refills | Status: DC | PRN

## 2021-09-17 ENCOUNTER — Encounter (INDEPENDENT_AMBULATORY_CARE_PROVIDER_SITE_OTHER): Payer: BC Managed Care – PPO

## 2021-09-17 DIAGNOSIS — F410 Panic disorder [episodic paroxysmal anxiety] without agoraphobia: Secondary | ICD-10-CM | POA: Diagnosis not present

## 2021-09-17 MED ORDER — CLONAZEPAM 1 MG PO TABS: 1.0000 mg | ORAL_TABLET | Freq: Two times a day (BID) | ORAL | 0 refills | Status: DC | PRN

## 2021-09-17 NOTE — Telephone Encounter (Signed)
I spent 5 total minutes of online digital evaluation and management services. 

## 2022-04-08 ENCOUNTER — Other Ambulatory Visit: Payer: Self-pay | Admitting: Sports Medicine

## 2022-04-08 DIAGNOSIS — F410 Panic disorder [episodic paroxysmal anxiety] without agoraphobia: Secondary | ICD-10-CM

## 2022-04-08 MED ORDER — CLONAZEPAM 1 MG PO TABS: 1.0000 mg | ORAL_TABLET | Freq: Two times a day (BID) | ORAL | 0 refills | Status: DC | PRN

## 2022-04-21 ENCOUNTER — Telehealth: Payer: Self-pay | Admitting: General Practice

## 2022-04-21 NOTE — Telephone Encounter (Signed)
Transition Care Management Unsuccessful Follow-up Telephone Call  Date of discharge and from where:  04/20/22 from [facility]  Attempts:  1st Attempt  Reason for unsuccessful TCM follow-up call:  Left voice message

## 2022-04-22 NOTE — Telephone Encounter (Signed)
Transition Care Management Unsuccessful Follow-up Telephone Call  Date of discharge and from where:  04/20/22 from [facility]  Attempts:  2nd Attempt  Reason for unsuccessful TCM follow-up call:  Left voice message

## 2022-04-28 NOTE — Telephone Encounter (Signed)
Transition Care Management Unsuccessful Follow-up Telephone Call  Date of discharge and from where:  04/20/22 from [facility]  Attempts:  3rd Attempt  Reason for unsuccessful TCM follow-up call:  Left voice message

## 2022-05-05 ENCOUNTER — Encounter: Payer: Self-pay | Admitting: Sports Medicine

## 2022-05-05 ENCOUNTER — Other Ambulatory Visit: Payer: Self-pay | Admitting: Sports Medicine

## 2022-05-05 ENCOUNTER — Ambulatory Visit (INDEPENDENT_AMBULATORY_CARE_PROVIDER_SITE_OTHER): Payer: 59 | Admitting: Sports Medicine

## 2022-05-05 DIAGNOSIS — K8050 Calculus of bile duct without cholangitis or cholecystitis without obstruction: Secondary | ICD-10-CM

## 2022-05-05 DIAGNOSIS — F32A Depression, unspecified: Secondary | ICD-10-CM

## 2022-05-05 DIAGNOSIS — F419 Anxiety disorder, unspecified: Secondary | ICD-10-CM

## 2022-05-05 DIAGNOSIS — F410 Panic disorder [episodic paroxysmal anxiety] without agoraphobia: Secondary | ICD-10-CM

## 2022-05-05 DIAGNOSIS — K851 Biliary acute pancreatitis without necrosis or infection: Secondary | ICD-10-CM | POA: Diagnosis not present

## 2022-05-05 DIAGNOSIS — Z299 Encounter for prophylactic measures, unspecified: Secondary | ICD-10-CM

## 2022-05-05 MED ORDER — LEVONORGEST-ETH ESTRAD 91-DAY 0.15-0.03 &0.01 MG PO TABS: 1.0000 | ORAL_TABLET | Freq: Every day | ORAL | 4 refills | Status: DC

## 2022-05-05 MED ORDER — CLONAZEPAM 1 MG PO TABS: 1.0000 mg | ORAL_TABLET | Freq: Two times a day (BID) | ORAL | 0 refills | Status: DC | PRN

## 2022-05-05 MED ORDER — FLUOXETINE HCL 20 MG PO TABS: 20.0000 mg | ORAL_TABLET | Freq: Every day | ORAL | 3 refills | Status: DC

## 2022-05-05 NOTE — Assessment & Plan Note (Signed)
Pleasant 21-year-old female, has had on and off crampy abdominal pain right upper quadrant with nausea for some time now, recently had severe and worsening midepigastric abdominal pain, was seen in the emergency department at [facility] where lipase was over thousand. CT unrevealing with the exception of some constipation and a stone filled gallbladder, ultrasound also showed a stone filled gallbladder, no CBD dilatation at 4 mm and no wall thickening. She does not consume alcohol. Triglycerides were normal in the hospital. Her symptoms had resolved by the time she had her imaging done. She was told to switch her birth control from combination oral contraceptives to progesterone only and it was suggested this was the cause of her pancreatitis. I suspect her pancreatitis was more likely due to the most common cause, gallstone pancreatitis considering her episodes of biliary colic. I have suggested that she discuss this with general surgery for consideration of cholecystectomy. We will recheck her labs including amylase and lipase today. 

## 2022-05-05 NOTE — Assessment & Plan Note (Signed)
Pleasant 21 year old female, has had on and off crampy abdominal pain right upper quadrant with nausea for some time now, recently had severe and worsening midepigastric abdominal pain, was seen in the emergency department at [facility] where lipase was over thousand. CT unrevealing with the exception of some constipation and a stone filled gallbladder, ultrasound also showed a stone filled gallbladder, no CBD dilatation at 4 mm and no wall thickening. She does not consume alcohol. Triglycerides were normal in the hospital. Her symptoms had resolved by the time she had her imaging done. She was told to switch her birth control from combination oral contraceptives to progesterone only and it was suggested this was the cause of her pancreatitis. I suspect her pancreatitis was more likely due to the most common cause, gallstone pancreatitis considering her episodes of biliary colic. I have suggested that she discuss this with general surgery for consideration of cholecystectomy. We will recheck her labs including amylase and lipase today.

## 2022-05-05 NOTE — Assessment & Plan Note (Signed)
Switching birth control back to her combination oral contraceptive.

## 2022-05-05 NOTE — Assessment & Plan Note (Signed)
Historically symptoms well controlled on fluoxetine, restarting. I will also refill her Klonopin. Return to see me in 6 weeks for reevaluation of anxiety.

## 2022-05-05 NOTE — Progress Notes (Signed)
    Procedures performed today:    None.  Independent interpretation of notes and tests performed by another provider:   None.  Brief History, Exam, Impression, and Recommendations:    Gallstone pancreatitis Pleasant 21 year old female, has had on and off crampy abdominal pain right upper quadrant with nausea for some time now, recently had severe and worsening midepigastric abdominal pain, was seen in the emergency department at [facility] where lipase was over thousand. CT unrevealing with the exception of some constipation and a stone filled gallbladder, ultrasound also showed a stone filled gallbladder, no CBD dilatation at 4 mm and no wall thickening. She does not consume alcohol. Triglycerides were normal in the hospital. Her symptoms had resolved by the time she had her imaging done. She was told to switch her birth control from combination oral contraceptives to progesterone only and it was suggested this was the cause of her pancreatitis. I suspect her pancreatitis was more likely due to the most common cause, gallstone pancreatitis considering her episodes of biliary colic. I have suggested that she discuss this with general surgery for consideration of cholecystectomy. We will recheck her labs including amylase and lipase today.  Biliary colic Pleasant 21 year old female, has had on and off crampy abdominal pain right upper quadrant with nausea for some time now, recently had severe and worsening midepigastric abdominal pain, was seen in the emergency department at [facility] where lipase was over thousand. CT unrevealing with the exception of some constipation and a stone filled gallbladder, ultrasound also showed a stone filled gallbladder, no CBD dilatation at 4 mm and no wall thickening. She does not consume alcohol. Triglycerides were normal in the hospital. Her symptoms had resolved by the time she had her imaging done. She  was told to switch her birth control from combination oral contraceptives to progesterone only and it was suggested this was the cause of her pancreatitis. I suspect her pancreatitis was more likely due to the most common cause, gallstone pancreatitis considering her episodes of biliary colic. I have suggested that she discuss this with general surgery for consideration of cholecystectomy. We will recheck her labs including amylase and lipase today.  Anxiety and depression Historically symptoms well controlled on fluoxetine, restarting. I will also refill her Klonopin. Return to see me in 6 weeks for reevaluation of anxiety.  Preventive measure Switching birth control back to her combination oral contraceptive.    ___________________________________________ Ihor Austin. Benjamin Stain, M.D., ABFM., CAQSM. Primary Care and Sports Medicine [facility]  Adjunct Instructor of Family Medicine  [employer]

## 2022-05-06 LAB — CBC WITH DIFFERENTIAL/PLATELET
Absolute Monocytes: 319 cells/uL (ref 200–950)
Basophils Absolute: 22 cells/uL (ref 0–200)
Basophils Relative: 0.4 %
Eosinophils Absolute: 78 cells/uL (ref 15–500)
Eosinophils Relative: 1.4 %
HCT: 44.7 % (ref 35.0–45.0)
Hemoglobin: 15 g/dL (ref 11.7–15.5)
Lymphs Abs: 2542 cells/uL (ref 850–3900)
MCH: 30.9 pg (ref 27.0–33.0)
MCHC: 33.6 g/dL (ref 32.0–36.0)
MCV: 92.2 fL (ref 80.0–100.0)
MPV: 11 fL (ref 7.5–12.5)
Monocytes Relative: 5.7 %
Neutro Abs: 2638 cells/uL (ref 1500–7800)
Neutrophils Relative %: 47.1 %
Platelets: 279 10*3/uL (ref 140–400)
RBC: 4.85 10*6/uL (ref 3.80–5.10)
RDW: 12 % (ref 11.0–15.0)
Total Lymphocyte: 45.4 %
WBC: 5.6 10*3/uL (ref 3.8–10.8)

## 2022-05-06 LAB — COMPLETE METABOLIC PANEL WITH GFR
AG Ratio: 1.6 (calc) (ref 1.0–2.5)
ALT: 26 U/L (ref 6–29)
AST: 17 U/L (ref 10–30)
Albumin: 4.7 g/dL (ref 3.6–5.1)
Alkaline phosphatase (APISO): 62 U/L (ref 31–125)
BUN: 9 mg/dL (ref 7–25)
CO2: 26 mmol/L (ref 20–32)
Calcium: 10.2 mg/dL (ref 8.6–10.2)
Chloride: 104 mmol/L (ref 98–110)
Creat: 0.73 mg/dL (ref 0.50–0.96)
Globulin: 3 g/dL (calc) (ref 1.9–3.7)
Glucose, Bld: 81 mg/dL (ref 65–99)
Potassium: 4.4 mmol/L (ref 3.5–5.3)
Sodium: 140 mmol/L (ref 135–146)
Total Bilirubin: 0.6 mg/dL (ref 0.2–1.2)
Total Protein: 7.7 g/dL (ref 6.1–8.1)
eGFR: 120 mL/min/{1.73_m2} (ref >=60)

## 2022-05-06 LAB — AMYLASE: Amylase: 53 U/L (ref 21–101)

## 2022-05-06 LAB — LIPASE: Lipase: 44 U/L (ref 7–60)

## 2022-05-06 MED ORDER — FLUOXETINE HCL 20 MG PO CAPS: 20.0000 mg | ORAL_CAPSULE | Freq: Every day | ORAL | 0 refills | Status: DC

## 2022-06-03 ENCOUNTER — Other Ambulatory Visit: Payer: Self-pay | Admitting: Sports Medicine

## 2022-06-03 MED ORDER — FLUOXETINE HCL 20 MG PO CAPS: 20.0000 mg | ORAL_CAPSULE | Freq: Every day | ORAL | 0 refills | Status: DC

## 2022-06-16 ENCOUNTER — Telehealth (INDEPENDENT_AMBULATORY_CARE_PROVIDER_SITE_OTHER): Payer: 59 | Admitting: Sports Medicine

## 2022-06-16 DIAGNOSIS — Z299 Encounter for prophylactic measures, unspecified: Secondary | ICD-10-CM

## 2022-06-16 DIAGNOSIS — E669 Obesity, unspecified: Secondary | ICD-10-CM

## 2022-06-16 DIAGNOSIS — F419 Anxiety disorder, unspecified: Secondary | ICD-10-CM | POA: Diagnosis not present

## 2022-06-16 DIAGNOSIS — F32A Depression, unspecified: Secondary | ICD-10-CM

## 2022-06-16 MED ORDER — WEGOVY 0.25 MG/0.5ML ~~LOC~~ SOAJ: 0.2500 mg | SUBCUTANEOUS | 0 refills | Status: DC

## 2022-06-16 MED ORDER — ONDANSETRON 8 MG PO TBDP: 8.0000 mg | ORAL_TABLET | Freq: Three times a day (TID) | ORAL | 3 refills | Status: DC | PRN

## 2022-06-16 MED ORDER — NORETHINDRONE 0.35 MG PO TABS: 1.0000 | ORAL_TABLET | Freq: Every day | ORAL | 3 refills | Status: DC

## 2022-06-16 MED ORDER — FLUOXETINE HCL 20 MG PO CAPS: 20.0000 mg | ORAL_CAPSULE | Freq: Every day | ORAL | 3 refills | Status: DC

## 2022-06-16 NOTE — Progress Notes (Signed)
   Virtual Visit via WebEx/MyChart   I connected with  Rachel Padilla  on 06/16/22 via WebEx/MyChart/Doximity Video and verified that I am speaking with the correct person using two identifiers.   I discussed the limitations, risks, security and privacy concerns of performing an evaluation and management service by WebEx/MyChart/Doximity Video, including the higher likelihood of inaccurate diagnosis and treatment, and the availability of in person appointments.  We also discussed the likely need of an additional face to face encounter for complete and high quality delivery of care.  I also discussed with the patient that there may be a patient responsible charge related to this service. The patient expressed understanding and wishes to proceed.  Provider location is in medical facility. Patient location is at their home, different from provider location. People involved in care of the patient during this telehealth encounter were myself, my nurse/medical assistant, and my front office/scheduling team member.  Review of Systems: No fevers, chills, night sweats, weight loss, chest pain, or shortness of breath.   Objective Findings:    General: Speaking full sentences, no audible heavy breathing.  Sounds alert and appropriately interactive.  Appears well.  Face symmetric.  Extraocular movements intact.  Pupils equal and round.  No nasal flaring or accessory muscle use visualized.  Independent interpretation of tests performed by another provider:   None.  Brief History, Exam, Impression, and Recommendations:    Preventive measure Rachel Padilla is on a new birth control, Micronor, calling a 6-month supply.  Anxiety and depression Under extremely good control with fluoxetine, refilling with a 13-month supply.  Obesity Known obesity, adding Wegovy, she will be part of a multidisciplinary weight loss program with calorie counting, and exercise prescription. Adding Zofran.   I discussed the above  assessment and treatment plan with the patient. The patient was provided an opportunity to ask questions and all were answered. The patient agreed with the plan and demonstrated an understanding of the instructions.   The patient was advised to call back or seek an in-person evaluation if the symptoms worsen or if the condition fails to improve as anticipated.   I provided 30 minutes of face to face and non-face-to-face time during this encounter date, time was needed to gather information, review chart, records, communicate/coordinate with staff remotely, as well as complete documentation.   ____________________________________________ Ihor Austin. Benjamin Stain, M.D., ABFM., CAQSM., AME. Primary Care and Sports Medicine [facility]  Adjunct Professor of Family Medicine  [employer]  Restaurant manager, fast food

## 2022-06-16 NOTE — Assessment & Plan Note (Signed)
Under extremely good control with fluoxetine, refilling with a 27-month supply.

## 2022-06-16 NOTE — Assessment & Plan Note (Signed)
Rachel Padilla is on a new birth control, Micronor, calling a 75-month supply.

## 2022-06-16 NOTE — Assessment & Plan Note (Signed)
Known obesity, adding Wegovy, she will be part of a multidisciplinary weight loss program with calorie counting, and exercise prescription. Adding Zofran.

## 2022-06-19 ENCOUNTER — Encounter: Payer: Self-pay | Admitting: Sports Medicine

## 2022-09-14 ENCOUNTER — Other Ambulatory Visit: Payer: Self-pay | Admitting: Sports Medicine

## 2022-09-14 DIAGNOSIS — F32A Depression, unspecified: Secondary | ICD-10-CM

## 2022-09-14 MED ORDER — CLONAZEPAM 1 MG PO TABS: 1.0000 mg | ORAL_TABLET | Freq: Two times a day (BID) | ORAL | 0 refills | Status: DC | PRN

## 2022-09-27 ENCOUNTER — Encounter: Payer: Self-pay | Admitting: Sports Medicine

## 2022-09-28 MED ORDER — VALACYCLOVIR HCL 500 MG PO TABS: 500.0000 mg | ORAL_TABLET | Freq: Every day | ORAL | 3 refills | Status: DC

## 2022-09-28 MED ORDER — VALACYCLOVIR HCL 500 MG PO TABS: ORAL_TABLET | ORAL | 11 refills | Status: DC

## 2022-09-28 NOTE — Addendum Note (Signed)
Addended by: Silverio Decamp on: 09/28/2022 03:48 PM   Modules accepted: Orders

## 2022-12-17 ENCOUNTER — Other Ambulatory Visit: Payer: Self-pay | Admitting: Sports Medicine

## 2022-12-17 DIAGNOSIS — F32A Depression, unspecified: Secondary | ICD-10-CM

## 2022-12-21 ENCOUNTER — Other Ambulatory Visit: Payer: Self-pay | Admitting: Sports Medicine

## 2022-12-21 DIAGNOSIS — F32A Depression, unspecified: Secondary | ICD-10-CM

## 2022-12-28 ENCOUNTER — Encounter: Payer: Self-pay | Admitting: Sports Medicine

## 2023-01-05 ENCOUNTER — Other Ambulatory Visit: Payer: Self-pay | Admitting: Sports Medicine

## 2023-01-05 DIAGNOSIS — E669 Obesity, unspecified: Secondary | ICD-10-CM

## 2023-01-13 ENCOUNTER — Encounter (INDEPENDENT_AMBULATORY_CARE_PROVIDER_SITE_OTHER): Payer: Self-pay | Admitting: Sports Medicine

## 2023-01-13 DIAGNOSIS — N760 Acute vaginitis: Secondary | ICD-10-CM

## 2023-01-14 DIAGNOSIS — N760 Acute vaginitis: Secondary | ICD-10-CM | POA: Insufficient documentation

## 2023-01-14 MED ORDER — FLUCONAZOLE 150 MG PO TABS: 150.0000 mg | ORAL_TABLET | Freq: Once | ORAL | 0 refills | Status: DC

## 2023-01-14 NOTE — Telephone Encounter (Signed)
I spent 5 total minutes of online digital evaluation and management services in this patient-initiated request for online care. 

## 2023-01-14 NOTE — Assessment & Plan Note (Signed)
Unclear etiology, patient suspects candidiasis, adding Diflucan, she understands that BV can present similarly and would need additional follow-up if she does not get improvement with the Diflucan.

## 2023-01-27 DIAGNOSIS — J029 Acute pharyngitis, unspecified: Secondary | ICD-10-CM | POA: Diagnosis not present

## 2023-01-27 DIAGNOSIS — Z1152 Encounter for screening for COVID-19: Secondary | ICD-10-CM | POA: Diagnosis not present

## 2023-01-27 DIAGNOSIS — B349 Viral infection, unspecified: Secondary | ICD-10-CM | POA: Diagnosis not present

## 2023-03-30 ENCOUNTER — Other Ambulatory Visit: Payer: Self-pay | Admitting: Sports Medicine

## 2023-03-30 DIAGNOSIS — F32A Depression, unspecified: Secondary | ICD-10-CM

## 2023-03-30 MED ORDER — CLONAZEPAM 1 MG PO TABS: 1.0000 mg | ORAL_TABLET | Freq: Two times a day (BID) | ORAL | 0 refills | Status: DC | PRN

## 2023-04-23 ENCOUNTER — Other Ambulatory Visit: Payer: Self-pay | Admitting: Sports Medicine

## 2023-04-23 DIAGNOSIS — F32A Depression, unspecified: Secondary | ICD-10-CM

## 2023-04-26 ENCOUNTER — Encounter: Payer: Self-pay | Admitting: Sports Medicine

## 2023-04-26 DIAGNOSIS — F419 Anxiety disorder, unspecified: Secondary | ICD-10-CM

## 2023-04-27 MED ORDER — FLUOXETINE HCL 20 MG PO CAPS: 20.0000 mg | ORAL_CAPSULE | Freq: Every day | ORAL | 0 refills | Status: DC

## 2023-05-19 ENCOUNTER — Telehealth (INDEPENDENT_AMBULATORY_CARE_PROVIDER_SITE_OTHER): Payer: 59 | Admitting: Sports Medicine

## 2023-05-19 ENCOUNTER — Encounter: Payer: Self-pay | Admitting: Sports Medicine

## 2023-05-19 ENCOUNTER — Other Ambulatory Visit: Payer: Self-pay | Admitting: Sports Medicine

## 2023-05-19 DIAGNOSIS — F32A Depression, unspecified: Secondary | ICD-10-CM

## 2023-05-19 DIAGNOSIS — F419 Anxiety disorder, unspecified: Secondary | ICD-10-CM

## 2023-05-19 MED ORDER — FLUOXETINE HCL 40 MG PO CAPS: 40.0000 mg | ORAL_CAPSULE | Freq: Every day | ORAL | 11 refills | Status: DC

## 2023-05-19 NOTE — Assessment & Plan Note (Signed)
This is a very pleasant 22 year old female, history of anxiety depression, she was historically well-controlled on Prozac 20, now having increasing depressive symptoms with anhedonia, we did discuss going up on medication, adding behavioral therapy, exercise therapy, she will commit to 20 minutes of moderate intensity walking daily, we will set her up with a behavioral therapist of her choice and I like to see her back in 6 weeks in another telephone visit to reevaluate.

## 2023-05-19 NOTE — Progress Notes (Signed)
   Virtual Visit via Telephone   I connected with  Archie Balboa  on 05/19/23 by telephone/telehealth and verified that I am speaking with the correct person using two identifiers.   I discussed the limitations, risks, security and privacy concerns of performing an evaluation and management service by telephone, including the higher likelihood of inaccurate diagnosis and treatment, and the availability of in person appointments.  We also discussed the likely need of an additional face to face encounter for complete and high quality delivery of care.  I also discussed with the patient that there may be a patient responsible charge related to this service. The patient expressed understanding and wishes to proceed.  Provider location is in medical facility. Patient location is at their home, different from provider location. People involved in care of the patient during this telehealth encounter were myself, my nurse/medical assistant, and my front office/scheduling team member.  Review of Systems: No fevers, chills, night sweats, weight loss, chest pain, or shortness of breath.   Objective Findings:    General: Speaking full sentences, no audible heavy breathing.  Sounds alert and appropriately interactive.    Independent interpretation of tests performed by another provider:   None.  Brief History, Exam, Impression, and Recommendations:    Anxiety and depression This is a very pleasant 22 year old female, history of anxiety depression, she was historically well-controlled on Prozac 20, now having increasing depressive symptoms with anhedonia, we did discuss going up on medication, adding behavioral therapy, exercise therapy, she will commit to 20 minutes of moderate intensity walking daily, we will set her up with a behavioral therapist of her choice and I like to see her back in 6 weeks in another telephone visit to reevaluate.   I discussed the above assessment and treatment plan with the  patient. The patient was provided an opportunity to ask questions and all were answered. The patient agreed with the plan and demonstrated an understanding of the instructions.   The patient was advised to call back or seek an in-person evaluation if the symptoms worsen or if the condition fails to improve as anticipated.   I provided 30 minutes of verbal and non-verbal time during this encounter date, time was needed to gather information, review chart, records, communicate/coordinate with staff remotely, as well as complete documentation.   ____________________________________________ Ihor Austin. Benjamin Stain, M.D., ABFM., CAQSM., AME. Primary Care and Sports Medicine [facility]  Adjunct Professor of Family Medicine  [employer] [employer] [employer]  Restaurant manager, fast food

## 2023-05-20 ENCOUNTER — Encounter: Payer: Self-pay | Admitting: Sports Medicine

## 2023-05-27 ENCOUNTER — Encounter (INDEPENDENT_AMBULATORY_CARE_PROVIDER_SITE_OTHER): Payer: 59 | Admitting: Sports Medicine

## 2023-05-27 DIAGNOSIS — L700 Acne vulgaris: Secondary | ICD-10-CM

## 2023-05-27 MED ORDER — TRETINOIN 0.05 % EX CREA
TOPICAL_CREAM | Freq: Every day | CUTANEOUS | 11 refills | Status: AC
Start: 2023-05-27 — End: ?

## 2023-05-27 NOTE — Telephone Encounter (Signed)
I spent 5 total minutes of online digital evaluation and management services in this patient-initiated request for online care. 

## 2023-05-31 ENCOUNTER — Other Ambulatory Visit: Payer: Self-pay | Admitting: Sports Medicine

## 2023-05-31 DIAGNOSIS — F419 Anxiety disorder, unspecified: Secondary | ICD-10-CM

## 2023-05-31 DIAGNOSIS — N760 Acute vaginitis: Secondary | ICD-10-CM

## 2023-06-10 ENCOUNTER — Other Ambulatory Visit: Payer: Self-pay | Admitting: Sports Medicine

## 2023-06-10 DIAGNOSIS — F32A Depression, unspecified: Secondary | ICD-10-CM

## 2023-06-10 DIAGNOSIS — F4322 Adjustment disorder with anxiety: Secondary | ICD-10-CM | POA: Diagnosis not present

## 2023-06-10 DIAGNOSIS — F331 Major depressive disorder, recurrent, moderate: Secondary | ICD-10-CM | POA: Diagnosis not present

## 2023-06-16 DIAGNOSIS — F4322 Adjustment disorder with anxiety: Secondary | ICD-10-CM | POA: Diagnosis not present

## 2023-06-16 DIAGNOSIS — F331 Major depressive disorder, recurrent, moderate: Secondary | ICD-10-CM | POA: Diagnosis not present

## 2023-06-17 ENCOUNTER — Other Ambulatory Visit: Payer: Self-pay | Admitting: Sports Medicine

## 2023-06-17 DIAGNOSIS — Z299 Encounter for prophylactic measures, unspecified: Secondary | ICD-10-CM

## 2023-06-24 DIAGNOSIS — F4322 Adjustment disorder with anxiety: Secondary | ICD-10-CM | POA: Diagnosis not present

## 2023-06-24 DIAGNOSIS — F331 Major depressive disorder, recurrent, moderate: Secondary | ICD-10-CM | POA: Diagnosis not present

## 2023-06-28 ENCOUNTER — Other Ambulatory Visit: Payer: Self-pay | Admitting: Sports Medicine

## 2023-06-28 ENCOUNTER — Telehealth: Payer: Self-pay | Admitting: Sports Medicine

## 2023-06-28 DIAGNOSIS — E669 Obesity, unspecified: Secondary | ICD-10-CM

## 2023-06-28 MED ORDER — WEGOVY 0.25 MG/0.5ML ~~LOC~~ SOAJ
0.2500 mg | SUBCUTANEOUS | 0 refills | Status: DC
Start: 2023-06-28 — End: 2023-07-09

## 2023-06-28 NOTE — Telephone Encounter (Signed)
Patient called in stating that the pharmacy needs a PA for [facility] 0.25MG    [facility] [address] [phone]

## 2023-06-29 ENCOUNTER — Telehealth: Payer: Self-pay | Admitting: Sports Medicine

## 2023-06-29 DIAGNOSIS — F331 Major depressive disorder, recurrent, moderate: Secondary | ICD-10-CM | POA: Diagnosis not present

## 2023-06-29 DIAGNOSIS — F4322 Adjustment disorder with anxiety: Secondary | ICD-10-CM | POA: Diagnosis not present

## 2023-06-29 NOTE — Telephone Encounter (Signed)
Patient states that pharmacy stated that her insurance doesn't cover [phone]

## 2023-07-05 DIAGNOSIS — F4322 Adjustment disorder with anxiety: Secondary | ICD-10-CM | POA: Diagnosis not present

## 2023-07-05 DIAGNOSIS — F331 Major depressive disorder, recurrent, moderate: Secondary | ICD-10-CM | POA: Diagnosis not present

## 2023-07-07 ENCOUNTER — Other Ambulatory Visit: Payer: Self-pay | Admitting: Sports Medicine

## 2023-07-07 DIAGNOSIS — F32A Anxiety disorder, unspecified: Secondary | ICD-10-CM

## 2023-07-08 ENCOUNTER — Telehealth: Payer: Self-pay | Admitting: Sports Medicine

## 2023-07-08 ENCOUNTER — Telehealth: Payer: Self-pay

## 2023-07-08 ENCOUNTER — Encounter (INDEPENDENT_AMBULATORY_CARE_PROVIDER_SITE_OTHER): Payer: 59 | Admitting: Sports Medicine

## 2023-07-08 DIAGNOSIS — E669 Obesity, unspecified: Secondary | ICD-10-CM | POA: Diagnosis not present

## 2023-07-08 NOTE — Telephone Encounter (Signed)
Patient called in to inform that Insurance sent another PA for wegovy.   PA Office for [employer] [phone]

## 2023-07-08 NOTE — Telephone Encounter (Signed)
Patient has a couple of options, generic compounded semaglutide or tirzepatide available at [facility].  Compounded semaglutide is essentially generic Wegovy, approximately $200 a month, if the same weekly injection but just needs to be pulled up in a syringe, tirzepatide is essentially generic Zepbound, about $350 a month, same method of administration.

## 2023-07-08 NOTE — Telephone Encounter (Addendum)
Initiated Prior authorization WNU:UVOZDG 0.25MG /0.5ML auto-injectors Via: Covermymeds Case/Key: [id number] Status: denied  as of 07/08/23 Reason:weight loss supplementation is a plan exclusion Notified Pt via: Mychart

## 2023-07-09 MED ORDER — SEMAGLUTIDE (2 MG/DOSE) 8 MG/3ML ~~LOC~~ SOPN
PEN_INJECTOR | SUBCUTANEOUS | 11 refills | Status: DC
Start: 2023-07-09 — End: 2024-02-25

## 2023-07-09 NOTE — Telephone Encounter (Signed)
I spent 5 total minutes of online digital evaluation and management services in this patient-initiated request for online care. 

## 2023-07-09 NOTE — Assessment & Plan Note (Signed)
Unable to get branded GLP-1's approved, has not lost sufficient weight with other modalities including dietary changes, exercise and phentermine. Starting compounded semaglutide, she will see me back after 1 month of the low-dose.

## 2023-07-12 DIAGNOSIS — F331 Major depressive disorder, recurrent, moderate: Secondary | ICD-10-CM | POA: Diagnosis not present

## 2023-07-12 DIAGNOSIS — F4322 Adjustment disorder with anxiety: Secondary | ICD-10-CM | POA: Diagnosis not present

## 2023-07-20 DIAGNOSIS — F4322 Adjustment disorder with anxiety: Secondary | ICD-10-CM | POA: Diagnosis not present

## 2023-07-20 DIAGNOSIS — F331 Major depressive disorder, recurrent, moderate: Secondary | ICD-10-CM | POA: Diagnosis not present

## 2023-08-04 DIAGNOSIS — F331 Major depressive disorder, recurrent, moderate: Secondary | ICD-10-CM | POA: Diagnosis not present

## 2023-08-04 DIAGNOSIS — F4322 Adjustment disorder with anxiety: Secondary | ICD-10-CM | POA: Diagnosis not present

## 2023-08-10 DIAGNOSIS — F4322 Adjustment disorder with anxiety: Secondary | ICD-10-CM | POA: Diagnosis not present

## 2023-08-10 DIAGNOSIS — F331 Major depressive disorder, recurrent, moderate: Secondary | ICD-10-CM | POA: Diagnosis not present

## 2023-08-12 ENCOUNTER — Encounter: Payer: Self-pay | Admitting: Sports Medicine

## 2023-08-12 ENCOUNTER — Ambulatory Visit (INDEPENDENT_AMBULATORY_CARE_PROVIDER_SITE_OTHER): Payer: 59 | Admitting: Sports Medicine

## 2023-08-12 VITALS — BP 112/81 | HR 87 | Ht 65.0 in | Wt 228.0 lb

## 2023-08-12 DIAGNOSIS — F419 Anxiety disorder, unspecified: Secondary | ICD-10-CM

## 2023-08-12 DIAGNOSIS — F32A Depression, unspecified: Secondary | ICD-10-CM | POA: Diagnosis not present

## 2023-08-12 DIAGNOSIS — Z113 Encounter for screening for infections with a predominantly sexual mode of transmission: Secondary | ICD-10-CM | POA: Diagnosis not present

## 2023-08-12 DIAGNOSIS — E669 Obesity, unspecified: Secondary | ICD-10-CM | POA: Diagnosis not present

## 2023-08-12 MED ORDER — CLONAZEPAM 1 MG PO TABS
1.0000 mg | ORAL_TABLET | Freq: Two times a day (BID) | ORAL | 0 refills | Status: DC | PRN
Start: 2023-08-12 — End: 2023-12-16

## 2023-08-12 NOTE — Assessment & Plan Note (Signed)
Well-controlled, refilling clonazepam.

## 2023-08-12 NOTE — Assessment & Plan Note (Signed)
Currently on 20 units of compounded semaglutide, she will continue the up taper by 20 units/month, I would like to see her back in about 5 months at which point she will been on the maximum dose for maybe a month.

## 2023-08-12 NOTE — Assessment & Plan Note (Signed)
Known history of HSV-2, checking other STDs. Of note has not had outbreak in years.

## 2023-08-12 NOTE — Addendum Note (Signed)
Addended by: Monica Becton on: 08/12/2023 02:51 PM   Modules accepted: Orders

## 2023-08-12 NOTE — Progress Notes (Signed)
    Procedures performed today:    None.  Independent interpretation of notes and tests performed by another provider:   None.  Brief History, Exam, Impression, and Recommendations:    Anxiety and depression Well-controlled, refilling clonazepam.  Obesity Currently on 20 units of compounded semaglutide, she will continue the up taper by 20 units/month, I would like to see her back in about 5 months at which point she will been on the maximum dose for maybe a month.  Screening for STD (sexually transmitted disease) Known history of HSV-2, checking other STDs. Of note has not had outbreak in years.    ____________________________________________ Ihor Austin. Benjamin Stain, M.D., ABFM., CAQSM., AME. Primary Care and Sports Medicine [facility]  Adjunct Professor of Family Medicine  [employer] [employer] [facility] [employer]  Restaurant manager, fast food

## 2023-08-15 LAB — RPR+HIV+GC+CT PANEL
Chlamydia trachomatis, NAA: NEGATIVE
HIV Screen 4th Generation wRfx: NONREACTIVE
Neisseria Gonorrhoeae by PCR: NEGATIVE
RPR Ser Ql: NONREACTIVE

## 2023-08-15 LAB — ACUTE VIRAL HEPATITIS (HAV, HBV, HCV)
HCV Ab: NONREACTIVE
Hep A IgM: NEGATIVE
Hep B C IgM: NEGATIVE
Hepatitis B Surface Ag: NEGATIVE

## 2023-08-15 LAB — HCV INTERPRETATION

## 2023-08-17 DIAGNOSIS — F4322 Adjustment disorder with anxiety: Secondary | ICD-10-CM | POA: Diagnosis not present

## 2023-08-17 DIAGNOSIS — F331 Major depressive disorder, recurrent, moderate: Secondary | ICD-10-CM | POA: Diagnosis not present

## 2023-08-21 DIAGNOSIS — J029 Acute pharyngitis, unspecified: Secondary | ICD-10-CM | POA: Diagnosis not present

## 2023-08-25 ENCOUNTER — Encounter: Payer: Self-pay | Admitting: Family Medicine

## 2023-08-25 ENCOUNTER — Telehealth (INDEPENDENT_AMBULATORY_CARE_PROVIDER_SITE_OTHER): Payer: 59 | Admitting: Family Medicine

## 2023-08-25 VITALS — Ht 65.0 in | Wt 225.0 lb

## 2023-08-25 DIAGNOSIS — J40 Bronchitis, not specified as acute or chronic: Secondary | ICD-10-CM

## 2023-08-25 DIAGNOSIS — J329 Chronic sinusitis, unspecified: Secondary | ICD-10-CM | POA: Insufficient documentation

## 2023-08-25 DIAGNOSIS — F331 Major depressive disorder, recurrent, moderate: Secondary | ICD-10-CM | POA: Diagnosis not present

## 2023-08-25 DIAGNOSIS — F4322 Adjustment disorder with anxiety: Secondary | ICD-10-CM | POA: Diagnosis not present

## 2023-08-25 MED ORDER — FLUCONAZOLE 150 MG PO TABS: 150.0000 mg | ORAL_TABLET | Freq: Once | ORAL | 0 refills | Status: AC

## 2023-08-25 MED ORDER — AMOXICILLIN-POT CLAVULANATE 875-125 MG PO TABS: 1.0000 | ORAL_TABLET | Freq: Two times a day (BID) | ORAL | 0 refills | Status: DC

## 2023-08-25 NOTE — Progress Notes (Signed)
Sore throat, congestion, green mucus  Urgent Care on Saturday: COVID, Strep, Flu = negative Sent home.   Feeling ill all week.   Meds:  Dayquil, Nyquil

## 2023-08-25 NOTE — Progress Notes (Signed)
Rachel Padilla - 22 y.o. female MRN [id number]  Date of birth: [birth year]   This visit type was conducted due to national recommendations for restrictions regarding the COVID-19 Pandemic (e.g. social distancing).  This format is felt to be most appropriate for this patient at this time.  All issues noted in this document were discussed and addressed.  No physical exam was performed (except for noted visual exam findings with Video Visits).  I discussed the limitations of evaluation and management by telemedicine and the availability of in person appointments. The patient expressed understanding and agreed to proceed.  I connected withNAME@ on 08/25/23 at 11:30 AM EDT by a video enabled telemedicine application and verified that I am speaking with the correct person using two identifiers.  Present at visit: Everrett Coombe, DO Archie Balboa   Patient Location: home [address] [place] [address]   Provider location:   [facility]  Chief Complaint  Patient presents with   URI    HPI  Rachel Padilla is a 22 y.o. female who presents via audio/video conferencing for a telehealth visit today.  She has complaint of congestion, cough, fatigue, sinus pressure and sore throat.  Symptoms started last week.  Seen at urgent care on Saturday.  Had negative flu, covid and strep testing.  She thought she was getting better until today when she started to feel bad again.  Now with thick, yellow/green mucus. Denies fever, chills or difficulty breathing.    ROS:  A comprehensive ROS was completed and negative except as noted per HPI  Past Medical History:  Diagnosis Date   Anxiety    Depression     History reviewed. No pertinent surgical history.  Family History  Problem Relation Age of Onset   Hypertension Father     Social History   Socioeconomic History   Marital status: Single    Spouse name: Not on file   Number of children: Not on file   Years of education: Not on file    Highest education level: Associate degree: occupational, Scientist, product/process development, or vocational program  Occupational History   Not on file  Tobacco Use   Smoking status: Never   Smokeless tobacco: Never  Vaping Use   Vaping status: Every Day   Substances: Nicotine, Flavoring, Nicotine-salt  Substance and Sexual Activity   Alcohol use: Not Currently   Drug use: Not Currently   Sexual activity: Never  Other Topics Concern   Not on file  Social History Narrative   Not on file   Social Determinants of Health   Financial Resource Strain: High Risk (08/11/2023)   Overall Financial Resource Strain (CARDIA)    Difficulty of Paying Living Expenses: Hard  Food Insecurity: Low Risk  (08/21/2023)   Received from [facility]   Hunger Vital Sign    Worried About Running Out of Food in the Last Year: Never true    Ran Out of Food in the Last Year: Never true  Recent Concern: Food Insecurity - Food Insecurity Present (08/11/2023)   Hunger Vital Sign    Worried About Running Out of Food in the Last Year: Sometimes true    Ran Out of Food in the Last Year: Sometimes true  Transportation Needs: No Transportation Needs (08/21/2023)   Received from [facility]    In the past 12 months, has lack of reliable transportation kept you from medical appointments, meetings, work or from getting things needed for daily living? : No  Physical Activity: Unknown (08/11/2023)   Exercise Vital Sign    Days of Exercise per Week: 0 days    Minutes of Exercise per Session: Not on file  Stress: Stress Concern Present (08/11/2023)   [employer] of Occupational Health - Occupational Stress Questionnaire    Feeling of Stress : Rather much  Social Connections: Socially Isolated (08/11/2023)   Social Connection and Isolation Panel [NHANES]    Frequency of Communication with Friends and Family: More than three times a week    Frequency of Social Gatherings with Friends and Family: Twice a week    Attends  Religious Services: Never    Database administrator or Organizations: No    Attends Engineer, structural: Not on file    Marital Status: Never married  Intimate Partner Violence: Not on file     Current Outpatient Medications:    amoxicillin-clavulanate (AUGMENTIN) 875-125 MG tablet, Take 1 tablet by mouth 2 (two) times daily., Disp: 20 tablet, Rfl: 0   clonazePAM (KLONOPIN) 1 MG tablet, Take 1 tablet (1 mg total) by mouth 2 (two) times daily as needed for anxiety., Disp: 20 tablet, Rfl: 0   fluconazole (DIFLUCAN) 150 MG tablet, Take 1 tablet (150 mg total) by mouth once for 1 dose., Disp: 1 tablet, Rfl: 0   FLUoxetine (PROZAC) 40 MG capsule, Take 1 capsule (40 mg total) by mouth daily., Disp: 30 capsule, Rfl: 11   norethindrone (MICRONOR) 0.35 MG tablet, TAKE 1 TABLET BY MOUTH EVERY DAY, Disp: 84 tablet, Rfl: 3   ondansetron (ZOFRAN-ODT) 8 MG disintegrating tablet, Take 1 tablet (8 mg total) by mouth every 8 (eight) hours as needed for nausea., Disp: 20 tablet, Rfl: 3   Semaglutide, 2 MG/DOSE, 8 MG/3ML SOPN, Semaglutide 2.5 mg/mL + Vit B6 10mg /mL.Inject 10u/0.40mL/0.25 mg subcu weekly x4 weeks then 20u/0.77mL/0.5 mg subcu weekly x4 weeks, then 40u/0.67mL/1 mg subcu weekly x4 weeks then 68u/0.71mL/1.7mg  subcu weekly x4 weeks then 100u/60mL/2.5mg  subcu weekly., Disp: 2 mL, Rfl: 11   tretinoin (RETIN-A) 0.05 % cream, Apply topically at bedtime., Disp: 45 g, Rfl: 11   valACYclovir (VALTREX) 500 MG tablet, Take 1 tablet (500 mg total) by mouth daily., Disp: 90 tablet, Rfl: 3  EXAM:  VITALS per patient if applicable: Ht 5\' 5"  (1.651 m)   Wt 225 lb (102.1 kg)   BMI 37.44 kg/m   GENERAL: alert, oriented, appears well and in no acute distress  HEENT: atraumatic, conjunttiva clear, no obvious abnormalities on inspection of external nose and ears  NECK: normal movements of the head and neck  LUNGS: on inspection no signs of respiratory distress, breathing rate appears normal, no obvious  gross SOB, gasping or wheezing  CV: no obvious cyanosis  MS: moves all visible extremities without noticeable abnormality  PSYCH/NEURO: pleasant and cooperative, no obvious depression or anxiety, speech and thought processing grossly intact  ASSESSMENT AND PLAN:  Discussed the following assessment and plan:  Sinobronchitis She probably initially had viral illness, now with secondary bacterial infection as she was improving and then had worsening of symptoms.  Adding course of augmentin.  Fluconazole called in as well as she reports yeast infections with antibiotics.  Recommend contacting clinic if symptoms fail to improve.       I discussed the assessment and treatment plan with the patient. The patient was provided an opportunity to ask questions and all were answered. The patient agreed with the plan and demonstrated an understanding of the instructions.   The patient was  advised to call back or seek an in-person evaluation if the symptoms worsen or if the condition fails to improve as anticipated.    Everrett Coombe, DO

## 2023-08-25 NOTE — Assessment & Plan Note (Signed)
She probably initially had viral illness, now with secondary bacterial infection as she was improving and then had worsening of symptoms.  Adding course of augmentin.  Fluconazole called in as well as she reports yeast infections with antibiotics.  Recommend contacting clinic if symptoms fail to improve.

## 2023-08-31 DIAGNOSIS — F331 Major depressive disorder, recurrent, moderate: Secondary | ICD-10-CM | POA: Diagnosis not present

## 2023-08-31 DIAGNOSIS — F4322 Adjustment disorder with anxiety: Secondary | ICD-10-CM | POA: Diagnosis not present

## 2023-09-07 DIAGNOSIS — F331 Major depressive disorder, recurrent, moderate: Secondary | ICD-10-CM | POA: Diagnosis not present

## 2023-09-07 DIAGNOSIS — F4322 Adjustment disorder with anxiety: Secondary | ICD-10-CM | POA: Diagnosis not present

## 2023-09-10 ENCOUNTER — Encounter: Payer: Self-pay | Admitting: Sports Medicine

## 2023-09-10 NOTE — Telephone Encounter (Signed)
Patient scheduled.

## 2023-09-14 ENCOUNTER — Ambulatory Visit: Payer: 59 | Admitting: Medical-Surgical

## 2023-09-14 DIAGNOSIS — F331 Major depressive disorder, recurrent, moderate: Secondary | ICD-10-CM | POA: Diagnosis not present

## 2023-09-14 DIAGNOSIS — F4322 Adjustment disorder with anxiety: Secondary | ICD-10-CM | POA: Diagnosis not present

## 2023-09-15 ENCOUNTER — Other Ambulatory Visit: Payer: Self-pay | Admitting: Sports Medicine

## 2023-09-28 DIAGNOSIS — F331 Major depressive disorder, recurrent, moderate: Secondary | ICD-10-CM | POA: Diagnosis not present

## 2023-09-28 DIAGNOSIS — F4322 Adjustment disorder with anxiety: Secondary | ICD-10-CM | POA: Diagnosis not present

## 2023-10-05 ENCOUNTER — Encounter: Payer: Self-pay | Admitting: Sports Medicine

## 2023-10-05 DIAGNOSIS — E669 Obesity, unspecified: Secondary | ICD-10-CM

## 2023-10-07 MED ORDER — ONDANSETRON 8 MG PO TBDP: 8.0000 mg | ORAL_TABLET | Freq: Three times a day (TID) | ORAL | 3 refills | Status: AC | PRN

## 2023-12-16 ENCOUNTER — Other Ambulatory Visit: Payer: Self-pay | Admitting: Sports Medicine

## 2023-12-16 DIAGNOSIS — F419 Anxiety disorder, unspecified: Secondary | ICD-10-CM

## 2023-12-17 MED ORDER — CLONAZEPAM 1 MG PO TABS
1.0000 mg | ORAL_TABLET | Freq: Two times a day (BID) | ORAL | 0 refills | Status: DC | PRN
Start: 2023-12-17 — End: 2024-02-06

## 2024-01-12 ENCOUNTER — Telehealth (INDEPENDENT_AMBULATORY_CARE_PROVIDER_SITE_OTHER): Payer: Self-pay | Admitting: Family Medicine

## 2024-01-12 VITALS — Ht 64.0 in | Wt 226.0 lb

## 2024-01-12 DIAGNOSIS — R0981 Nasal congestion: Secondary | ICD-10-CM

## 2024-01-12 DIAGNOSIS — R109 Unspecified abdominal pain: Secondary | ICD-10-CM

## 2024-01-12 NOTE — Progress Notes (Signed)
Established patient visit   Patient: Rachel Padilla   DOB: Jun 04, 2001   23 y.o. Female  MRN: [id number] Visit Date: 01/12/2024  Today's healthcare provider: Charlton Amor, DO   Chief Complaint  Patient presents with   Cough    SUBJECTIVE    Chief Complaint  Patient presents with   Cough   HPI   I connected with  Archie Balboa on 01/12/24 by a video and audio enabled telemedicine application and verified that I am speaking with the correct person using two identifiers.  Patient Location: Home  Provider Location: Home Office  I discussed the limitations of evaluation and management by telemedicine. The patient expressed understanding and agreed to proceed.  Pt on video visit for stomach ache, headaches, and ears feeling stopped up.  Two years ago she had pancreatitis. She says her pain has been worse and feels like the pancreatitis pain. She has been having a bad headache and also notes some congestion last week that has improved a little bit. She has been able to tolerate liquids. Does note a history of gallstones.  Review of Systems  Constitutional:  Negative for activity change, fatigue and fever.  HENT:  Positive for congestion. Negative for sinus pressure and sinus pain.   Respiratory:  Negative for cough and shortness of breath.   Cardiovascular:  Negative for chest pain.  Gastrointestinal:  Positive for abdominal pain.  Genitourinary:  Negative for difficulty urinating.       Current Meds  Medication Sig   amoxicillin-clavulanate (AUGMENTIN) 875-125 MG tablet Take 1 tablet by mouth 2 (two) times daily.   clonazePAM (KLONOPIN) 1 MG tablet Take 1 tablet (1 mg total) by mouth 2 (two) times daily as needed for anxiety.   FLUoxetine (PROZAC) 40 MG capsule Take 1 capsule (40 mg total) by mouth daily.   norethindrone (MICRONOR) 0.35 MG tablet TAKE 1 TABLET BY MOUTH EVERY DAY   ondansetron (ZOFRAN-ODT) 8 MG disintegrating tablet Take 1 tablet (8 mg total) by mouth every  8 (eight) hours as needed for nausea.   Semaglutide, 2 MG/DOSE, 8 MG/3ML SOPN Semaglutide 2.5 mg/mL + Vit B6 10mg /mL.Inject 10u/0.35mL/0.25 mg subcu weekly x4 weeks then 20u/0.63mL/0.5 mg subcu weekly x4 weeks, then 40u/0.29mL/1 mg subcu weekly x4 weeks then 68u/0.7mL/1.7mg  subcu weekly x4 weeks then 100u/15mL/2.5mg  subcu weekly.   tretinoin (RETIN-A) 0.05 % cream Apply topically at bedtime.   valACYclovir (VALTREX) 500 MG tablet TAKE 1 TABLET (500 MG TOTAL) BY MOUTH DAILY.    OBJECTIVE    Ht 5\' 4"  (1.626 m)   Wt 226 lb (102.5 kg)   BMI 38.79 kg/m   Physical Exam Vitals reviewed.  Constitutional:      Appearance: She is well-developed.  HENT:     Head: Normocephalic and atraumatic.  Eyes:     Conjunctiva/sclera: Conjunctivae normal.  Cardiovascular:     Rate and Rhythm: Normal rate.  Pulmonary:     Effort: Pulmonary effort is normal.  Skin:    General: Skin is dry.     Coloration: Skin is not pale.  Neurological:     Mental Status: She is alert and oriented to person, place, and time.  Psychiatric:        Behavior: Behavior normal.        ASSESSMENT & PLAN    Problem List Items Addressed This Visit       Other   Abdominal pain - Primary   Pt presents on video visit with abdominal pain. Says  it feels similar to when she had pancreatitis. Has not been able to tolerate solids but has been doing ok with liquids. Admits pain coming and going. Advised pt to go to ED to get blood work to rule out pancreatitis vs gallstones vs intra-abdominal pathology. Pt may need antibiotics. Did explain difficulty to exam and assess patient being on video visit. Told pt if this is pancreatitis they need to figure out what is the cause and at some point would need a gi referral if this is pancreatitis       Nasal congestion   Pt has been having some nasal congestion but denies any sinus pain or pressure. No fevers. Discussed that this could be the flu vs just a viral infection. Pt says she does  not have the ability to test. Recommended supportive care for vit c, d, and zinc       No follow-ups on file.      No orders of the defined types were placed in this encounter.   No orders of the defined types were placed in this encounter.    Charlton Amor, DO  [facility] Primary Care & Sports Medicine at [facility] [phone] (phone) [phone] (fax)  [facility]

## 2024-01-12 NOTE — Assessment & Plan Note (Addendum)
Pt presents on video visit with abdominal pain. Says it feels similar to when she had pancreatitis. Has not been able to tolerate solids but has been doing ok with liquids. Admits pain coming and going. Advised pt to go to ED to get blood work to rule out pancreatitis vs gallstones vs intra-abdominal pathology. Pt may need antibiotics. Did explain difficulty to exam and assess patient being on video visit. Told pt if this is pancreatitis they need to figure out what is the cause and at some point would need a gi referral if this is pancreatitis

## 2024-01-12 NOTE — Assessment & Plan Note (Signed)
Pt has been having some nasal congestion but denies any sinus pain or pressure. No fevers. Discussed that this could be the flu vs just a viral infection. Pt says she does not have the ability to test. Recommended supportive care for vit c, d, and zinc

## 2024-01-13 ENCOUNTER — Ambulatory Visit: Payer: 59 | Admitting: Sports Medicine

## 2024-01-24 ENCOUNTER — Telehealth: Payer: Self-pay

## 2024-01-24 NOTE — Transitions of Care (Post Inpatient/ED Visit) (Signed)
 01/24/2024  Name: Rachel Padilla MRN: [id number] DOB: Feb 05, 2001  Today's TOC FU Call Status: Today's TOC FU Call Status:: Successful TOC FU Call Completed TOC FU Call Complete Date: 01/24/24 Patient's Name and Date of Birth confirmed.  Transition Care Management Follow-up Telephone Call Date of Discharge: 01/22/24 Discharge Facility: Other [facility]) Name of Other (Non[facility]) Discharge Facility: [facility] Type of Discharge: Inpatient Admission Primary Inpatient Discharge Diagnosis:: Acute gallstone pancreatitis How have you been since you were released from the hospital?: Better (Patient states she is feeling much better) Any questions or concerns?: No (Patient denies)  Items Reviewed: Did you receive and understand the discharge instructions provided?: Yes (patient states she reviewed instructions and nurse in hospital reviewed with her) Medications obtained,verified, and reconciled?: Yes (Medications Reviewed) Any new allergies since your discharge?: No Dietary orders reviewed?: NA (Patient reports regular diet - also noted in record) Do you have support at home?: Yes People in Home: parent(s) Name of Support/Comfort Primary Source: Patient reports she normally lives alone but is staying with parents  Medications Reviewed Today: Medications Reviewed Today     Reviewed by Jessy Oto, RN (Registered Nurse) on 01/24/24 at 1155  Med List Status: <None>   Medication Order Taking? Sig Documenting Provider Last Dose Status Informant  aluminum-magnesium hydroxide-simethicone (MAALOX) 200-200-20 MG/5ML SUSP [id number] No Take 30 mLs by mouth See admin instructions. Take 30 mL by mouth every 4 (four) hours as needed.  Patient not taking: Reported on 01/24/2024   [provider] Not Taking Active            Med Note Morey Hummingbird, Alberto Schoch A   Mon Jan 24, 2024 11:47 AM) Patient reports has not needed since hospital discharge but has on hand   amoxicillin-clavulanate (AUGMENTIN) 875-125 MG tablet [id number] No Take 1 tablet by mouth 2 (two) times daily.  Patient not taking: Reported on 01/24/2024   Everrett Coombe, DO Not Taking Consider Medication Status and Discontinue (Completed Course)   clonazePAM (KLONOPIN) 1 MG tablet [id number] No Take 1 tablet (1 mg total) by mouth 2 (two) times daily as needed for anxiety.  Patient not taking: Reported on 01/24/2024   Monica Becton, MD Not Taking Active            Med Note [facility] [place] A   Mon Jan 24, 2024 11:48 AM) Patient states this is as needed and she has not needed it   FLUoxetine (PROZAC) 40 MG capsule [id number] Yes Take 1 capsule (40 mg total) by mouth daily. Monica Becton, MD Taking Active   ibuprofen (ADVIL) 600 MG tablet [id number] Yes Take 600 mg by mouth every 6 (six) hours as needed for moderate pain (pain score 4-6) or mild pain (pain score 1-3). [provider] Taking Active   norethindrone (MICRONOR) 0.35 MG tablet [id number] Yes TAKE 1 TABLET BY MOUTH EVERY DAY Monica Becton, MD Taking Active   ondansetron (ZOFRAN-ODT) 8 MG disintegrating tablet [id number] Yes Take 1 tablet (8 mg total) by mouth every 8 (eight) hours as needed for nausea. Monica Becton, MD Taking Active   oxyCODONE-acetaminophen (PERCOCET/ROXICET) 5-325 MG tablet [id number] Yes Take 1 tablet by mouth every 6 (six) hours as needed for severe pain (pain score 7-10). [provider] Taking Active   Semaglutide, 2 MG/DOSE, 8 MG/3ML SOPN [id number] No Semaglutide 2.5 mg/mL + Vit B6 10mg /mL.Inject 10u/0.70mL/0.25 mg subcu weekly x4 weeks then 20u/0.88mL/0.5 mg subcu weekly x4 weeks, then 40u/0.52mL/1 mg subcu weekly  x4 weeks then 68u/0.30mL/1.7mg  subcu weekly x4 weeks then 100u/52mL/2.5mg  subcu weekly.  Patient not taking: Reported on 01/24/2024   Monica Becton, MD Not Taking Consider Medication Status and Discontinue (Discontinued by provider)   tretinoin  (RETIN-A) 0.05 % cream [id number] Yes Apply topically at bedtime. Monica Becton, MD Taking Active   valACYclovir (VALTREX) 500 MG tablet [id number] Yes TAKE 1 TABLET (500 MG TOTAL) BY MOUTH DAILY. Monica Becton, MD Taking Active             Home Care and Equipment/Supplies: Were Home Health Services Ordered?: No Any new equipment or medical supplies ordered?: No  Functional Questionnaire: Do you need assistance with bathing/showering or dressing?: No (Patient reports she took her first shower last night) Do you need assistance with meal preparation?: No Do you need assistance with eating?: No Do you have difficulty maintaining continence: No Do you need assistance with getting out of bed/getting out of a chair/moving?: No Do you have difficulty managing or taking your medications?: No  Follow up appointments reviewed: PCP Follow-up appointment confirmed?: Yes Date of PCP follow-up appointment?: 01/28/24 Follow-up Provider: Monica Becton, MD Specialist Hospital Follow-up appointment confirmed?: Yes Date of Specialist follow-up appointment?: 02/09/24 Follow-Up Specialty Provider:: Hatem Abdallah Do you need transportation to your follow-up appointment?: No Do you understand care options if your condition(s) worsen?: Yes-patient verbalized understanding  SDOH Interventions Today    Flowsheet Row Most Recent Value  SDOH Interventions   Food Insecurity Interventions Intervention Not Indicated  Housing Interventions Intervention Not Indicated  Transportation Interventions Intervention Not Indicated  Utilities Interventions Intervention Not Indicated      TOC Interventions Today    Flowsheet Row Most Recent Value  TOC Interventions   TOC Interventions Discussed/Reviewed TOC Interventions Discussed, Contacted EMS/911 for emergency patient needs, Arranged PCP follow up within 7 days/Care Guide scheduled, Post op wound/incision care, S/S of infection        Interventions Today    Flowsheet Row Most Recent Value  Chronic Disease   Chronic disease during today's visit Other  [RecentAcute gallstone pancreatitis with lap chole]  General Interventions   General Interventions Discussed/Reviewed General Interventions Discussed  [Reviewed when to call surgeon]  Education Interventions   Education Provided Provided Education  [place] office or seek medical care if: fever greater than 101.5,  shaking chills,  increasing abdominal pain  Persistent nausea/ vomiting, Inability to hold down any food or fluids,leaking or drainage from your incisions  redness or pain at your incisions]  Provided Verbal Education On Nutrition  [Reviewed Regular diet]  Pharmacy Interventions   Pharmacy Dicussed/Reviewed Medications and their functions  [Reconcilled medications with patient]       Patient reports she is doing well. PCP appointment was scheduled for patient. Patient confirms date/time of surgeon follow up.   RNCM offered weekly call with 30 day program and patient denied need. Patient accepted RNCM's name/number and agreed to call with any new issues/questions/concerns.    Hilbert Odor RN, CCM [place]  VBCI-Population Health RN Care Manager [phone]

## 2024-01-28 ENCOUNTER — Encounter: Payer: Self-pay | Admitting: Sports Medicine

## 2024-01-28 ENCOUNTER — Ambulatory Visit: Payer: Self-pay | Admitting: Sports Medicine

## 2024-01-28 VITALS — BP 128/78 | HR 78 | Ht 64.0 in | Wt 226.0 lb

## 2024-01-28 DIAGNOSIS — E669 Obesity, unspecified: Secondary | ICD-10-CM | POA: Diagnosis not present

## 2024-01-28 DIAGNOSIS — K851 Biliary acute pancreatitis without necrosis or infection: Secondary | ICD-10-CM

## 2024-01-28 DIAGNOSIS — Z299 Encounter for prophylactic measures, unspecified: Secondary | ICD-10-CM | POA: Diagnosis not present

## 2024-01-28 NOTE — Progress Notes (Signed)
    Procedures performed today:    None.  Independent interpretation of notes and tests performed by another provider:   None.  Brief History, Exam, Impression, and Recommendations:    Preventive measure Due for cervical cancer screening, declines flu shot. Due for HPV and Tdap. She will have Joy do her Pap smear, I would like Joy to also discuss HPV and Tdap, patient is agreeable but just does not want to do it today.  Gallstone pancreatitis Postop day 7 gallstone pancreatitis with cholecystectomy. She is somewhat sore but doing well, cholecystectomy was robotic, portals are clean, dry, intact. I will go and check some labs to ensure pancreatitis has fully resolved.    ____________________________________________ Ihor Austin. Benjamin Stain, M.D., ABFM., CAQSM., AME. Primary Care and Sports Medicine [facility]  Adjunct Professor of Family Medicine  [employer]  Restaurant manager, fast food

## 2024-01-28 NOTE — Assessment & Plan Note (Signed)
 Due for cervical cancer screening, declines flu shot. Due for HPV and Tdap. She will have Joy do her Pap smear, I would like Joy to also discuss HPV and Tdap, patient is agreeable but just does not want to do it today.

## 2024-01-28 NOTE — Assessment & Plan Note (Signed)
 Postop day 7 gallstone pancreatitis with cholecystectomy. She is somewhat sore but doing well, cholecystectomy was robotic, portals are clean, dry, intact. I will go and check some labs to ensure pancreatitis has fully resolved.

## 2024-01-29 LAB — COMPREHENSIVE METABOLIC PANEL WITH GFR
ALT: 30 IU/L (ref 0–32)
AST: 22 IU/L (ref 0–40)
Albumin: 4.2 g/dL (ref 4.0–5.0)
Alkaline Phosphatase: 68 IU/L (ref 44–121)
BUN/Creatinine Ratio: 27 — ABNORMAL HIGH (ref 9–23)
BUN: 16 mg/dL (ref 6–20)
Bilirubin Total: 0.2 mg/dL (ref 0.0–1.2)
CO2: 20 mmol/L (ref 20–29)
Calcium: 9.6 mg/dL (ref 8.7–10.2)
Chloride: 105 mmol/L (ref 96–106)
Creatinine, Ser: 0.59 mg/dL (ref 0.57–1.00)
Globulin, Total: 2.6 g/dL (ref 1.5–4.5)
Glucose: 93 mg/dL (ref 70–99)
Potassium: 4.4 mmol/L (ref 3.5–5.2)
Sodium: 139 mmol/L (ref 134–144)
Total Protein: 6.8 g/dL (ref 6.0–8.5)
eGFR: 130 mL/min/1.73

## 2024-01-29 LAB — LIPID PANEL
Chol/HDL Ratio: 5.3 ratio — ABNORMAL HIGH (ref 0.0–4.4)
Cholesterol, Total: 184 mg/dL (ref 100–199)
HDL: 35 mg/dL — ABNORMAL LOW (ref >39)
LDL Chol Calc (NIH): 119 mg/dL — ABNORMAL HIGH (ref 0–99)
Triglycerides: 171 mg/dL — ABNORMAL HIGH (ref 0–149)
VLDL Cholesterol Cal: 30 mg/dL (ref 5–40)

## 2024-01-29 LAB — CBC
Hematocrit: 39 % (ref 34.0–46.6)
Hemoglobin: 12.8 g/dL (ref 11.1–15.9)
MCH: 30.1 pg (ref 26.6–33.0)
MCHC: 32.8 g/dL (ref 31.5–35.7)
MCV: 92 fL (ref 79–97)
Platelets: 301 10*3/uL (ref 150–450)
RBC: 4.25 x10E6/uL (ref 3.77–5.28)
RDW: 12.2 % (ref 11.7–15.4)
WBC: 6.4 10*3/uL (ref 3.4–10.8)

## 2024-01-29 LAB — LIPASE: Lipase: 67 U/L (ref 14–72)

## 2024-01-29 LAB — HEMOGLOBIN A1C
Est. average glucose Bld gHb Est-mCnc: 103 mg/dL
Hgb A1c MFr Bld: 5.2 % (ref 4.8–5.6)

## 2024-01-29 LAB — AMYLASE: Amylase: 74 U/L (ref 31–110)

## 2024-01-29 LAB — TSH: TSH: 0.929 u[IU]/mL (ref 0.450–4.500)

## 2024-02-06 ENCOUNTER — Other Ambulatory Visit: Payer: Self-pay | Admitting: Sports Medicine

## 2024-02-06 DIAGNOSIS — F419 Anxiety disorder, unspecified: Secondary | ICD-10-CM

## 2024-02-07 MED ORDER — CLONAZEPAM 1 MG PO TABS: 1.0000 mg | ORAL_TABLET | Freq: Two times a day (BID) | ORAL | 0 refills | Status: DC | PRN

## 2024-02-11 ENCOUNTER — Ambulatory Visit: Payer: No Typology Code available for payment source | Admitting: Medical-Surgical

## 2024-02-25 ENCOUNTER — Encounter: Payer: Self-pay | Admitting: Medical-Surgical

## 2024-02-25 ENCOUNTER — Other Ambulatory Visit (HOSPITAL_COMMUNITY)
Admission: RE | Admit: 2024-02-25 | Discharge: 2024-02-25 | Disposition: A | Discharge status: Home / Self Care | Source: Ambulatory Visit | Attending: Medical-Surgical | Admitting: Medical-Surgical

## 2024-02-25 ENCOUNTER — Ambulatory Visit: Admitting: Medical-Surgical

## 2024-02-25 VITALS — BP 93/65 | HR 69 | Resp 20 | Ht 64.0 in | Wt 231.0 lb

## 2024-02-25 DIAGNOSIS — Z124 Encounter for screening for malignant neoplasm of cervix: Secondary | ICD-10-CM | POA: Insufficient documentation

## 2024-02-25 DIAGNOSIS — Z6839 Body mass index (BMI) 39.0-39.9, adult: Secondary | ICD-10-CM

## 2024-02-25 DIAGNOSIS — E66812 Obesity, class 2: Secondary | ICD-10-CM | POA: Diagnosis not present

## 2024-02-25 MED ORDER — METFORMIN HCL ER 500 MG PO TB24: 500.0000 mg | ORAL_TABLET | Freq: Every day | ORAL | 1 refills | Status: AC

## 2024-02-25 NOTE — Progress Notes (Signed)
        Established patient visit  History, exam, impression, and plan:  1. Cervical cancer screening (Primary) Very pleasant 23 year old female presenting today for completion of a pap smear. Reports that she has had a pap smear before that was normal. She has a history of genital herpes and is on suppressive therapy with Valtrex. No recent outbreaks. Sexually active and wants to add STI testing today. Declined blood testing. Pap smear completed without difficulty. Adding evaluation for gonorrhea, chlamydia, and trichomonas to pap smear sample. Chaperoned by Isla Pence, CMA. Discussed need for HPV and Tdap vaccines but she would like to hold off today. Advised that she may return at her convenience for a nurse visit to have these done.  - Cytology - PAP  2. Class 2 obesity without serious comorbidity with body mass index (BMI) of 39.0 to 39.9 in adult, unspecified obesity type Has a history of obesity and is interested in discussing weight loss medications that may help her. Previously tried compounded semaglutide but notes that it did not suppress her appetite or help her lose weight. It actually increased her appetite and she stayed hungry all the time. No longer on this. Tried phentermine in the past which worked well the first time she used it but the second was not helpful. Topiramate was not well tolerated. Contrave caused daily headaches. Discussed further options of Zepbound but this will not be covered by insurance. Liposlim that will also be out of pocket, and Metformin. She would like to try Metformin XR 500 mg daily, prescription sent. Reviewed possible SE. Plan for close follow up in 4 weeks to evaluate tolerance and response. Briefly reviewed potential referral to [facility] but will see how Metformin does first.   Procedures performed this visit: None.  Return in about 4 weeks (around 03/24/2024) for Metformin follow up.  __________________________________ Thayer Ohm, DNP, APRN,  FNP-BC Primary Care and Sports Medicine [facility]

## 2024-03-01 ENCOUNTER — Encounter: Payer: Self-pay | Admitting: Medical-Surgical

## 2024-03-01 LAB — CYTOLOGY - PAP
Adequacy: ABSENT
Chlamydia: NEGATIVE
Comment: NEGATIVE
Comment: NEGATIVE
Comment: NEGATIVE
Comment: NORMAL
Diagnosis: UNDETERMINED — AB
High risk HPV: NEGATIVE
Neisseria Gonorrhea: NEGATIVE
Trichomonas: NEGATIVE

## 2024-03-24 ENCOUNTER — Ambulatory Visit: Admitting: Sports Medicine

## 2024-05-02 ENCOUNTER — Other Ambulatory Visit: Payer: Self-pay | Admitting: Sports Medicine

## 2024-05-02 DIAGNOSIS — F419 Anxiety disorder, unspecified: Secondary | ICD-10-CM

## 2024-05-16 ENCOUNTER — Other Ambulatory Visit: Payer: Self-pay | Admitting: Sports Medicine

## 2024-05-16 DIAGNOSIS — F32A Depression, unspecified: Secondary | ICD-10-CM

## 2024-08-01 ENCOUNTER — Encounter: Payer: Self-pay | Admitting: Sports Medicine

## 2024-08-04 ENCOUNTER — Other Ambulatory Visit: Payer: Self-pay | Admitting: Sports Medicine

## 2024-08-04 DIAGNOSIS — F419 Anxiety disorder, unspecified: Secondary | ICD-10-CM

## 2024-08-04 MED ORDER — CLONAZEPAM 1 MG PO TABS: 1.0000 mg | ORAL_TABLET | Freq: Two times a day (BID) | ORAL | 0 refills | Status: AC | PRN

## 2024-08-04 NOTE — Telephone Encounter (Signed)
 Copied from CRM [id number]. Topic: Clinical - Medication Refill >> Aug 04, 2024  2:38 PM Cherylann S wrote: Medication: clonazePAM  (KLONOPIN ) 1 MG tablet   Has the patient contacted their pharmacy? Yes (Agent: If no, request that the patient contact the pharmacy for the refill. If patient does not wish to contact the pharmacy document the reason why and proceed with request.) (Agent: If yes, when and what did the pharmacy advise?)  This is the patient's preferred pharmacy:  [id number] - [place], [place] - [address]OSS [address] [place] [address] Phone: [phone] Fax: [phone]  Is this the correct pharmacy for this prescription? Yes If no, delete pharmacy and type the correct one.   Has the prescription been filled recently? No  Is the patient out of the medication? Yes  Has the patient been seen for an appointment in the last year OR does the patient have an upcoming appointment? Yes  Can we respond through MyChart? Yes  Agent: Please be advised that Rx refills may take up to 3 business days. We ask that you follow-up with your pharmacy.

## 2024-08-14 ENCOUNTER — Other Ambulatory Visit: Payer: Self-pay

## 2024-08-14 DIAGNOSIS — Z299 Encounter for prophylactic measures, unspecified: Secondary | ICD-10-CM

## 2024-08-14 MED ORDER — NORETHINDRONE 0.35 MG PO TABS: 1.0000 | ORAL_TABLET | Freq: Every day | ORAL | 3 refills | Status: AC

## 2024-08-14 NOTE — Telephone Encounter (Signed)
 Former T pt; this is for her birth control and you did her PAP. Will you take his over?

## 2024-10-01 ENCOUNTER — Other Ambulatory Visit: Payer: Self-pay | Admitting: Physician Assistant

## 2024-10-01 DIAGNOSIS — F32A Depression, unspecified: Secondary | ICD-10-CM
